# Patient Record
Sex: Male | Born: 1960 | Race: Black or African American | Hispanic: No | Marital: Married | Smoking: Never smoker
Health system: Southern US, Community
[De-identification: ages and names within clinical notes are randomized; demographics above are authoritative.]

## PROBLEM LIST (undated history)

## (undated) DIAGNOSIS — E785 Hyperlipidemia, unspecified: Secondary | ICD-10-CM

## (undated) DIAGNOSIS — I2510 Atherosclerotic heart disease of native coronary artery without angina pectoris: Secondary | ICD-10-CM

## (undated) DIAGNOSIS — T7840XA Allergy, unspecified, initial encounter: Secondary | ICD-10-CM

## (undated) DIAGNOSIS — I10 Essential (primary) hypertension: Secondary | ICD-10-CM

## (undated) DIAGNOSIS — I509 Heart failure, unspecified: Secondary | ICD-10-CM

## (undated) DIAGNOSIS — E119 Type 2 diabetes mellitus without complications: Secondary | ICD-10-CM

## (undated) DIAGNOSIS — R7303 Prediabetes: Secondary | ICD-10-CM

## (undated) HISTORY — DX: Hyperlipidemia, unspecified: E78.5

## (undated) HISTORY — DX: Allergy, unspecified, initial encounter: T78.40XA

## (undated) HISTORY — DX: Atherosclerotic heart disease of native coronary artery without angina pectoris: I25.10

---

## 2004-05-23 ENCOUNTER — Ambulatory Visit: Payer: Self-pay | Admitting: Internal Medicine

## 2004-05-31 ENCOUNTER — Ambulatory Visit: Payer: Self-pay | Admitting: Internal Medicine

## 2006-06-12 ENCOUNTER — Ambulatory Visit: Payer: Self-pay | Admitting: Internal Medicine

## 2006-06-12 LAB — CONVERTED CEMR LAB
ALT: 31 units/L (ref 0–40)
AST: 29 units/L (ref 0–37)
Albumin: 4 g/dL (ref 3.5–5.2)
Alkaline Phosphatase: 47 units/L (ref 39–117)
BUN: 10 mg/dL (ref 6–23)
Basophils Absolute: 0 10*3/uL (ref 0.0–0.1)
Basophils Relative: 0.1 % (ref 0.0–1.0)
Bilirubin, Direct: 0.1 mg/dL (ref 0.0–0.3)
CO2: 31 meq/L (ref 19–32)
Calcium: 9.8 mg/dL (ref 8.4–10.5)
Chloride: 100 meq/L (ref 96–112)
Cholesterol: 193 mg/dL (ref 0–200)
Creatinine, Ser: 1.3 mg/dL (ref 0.4–1.5)
Eosinophils Absolute: 0.2 10*3/uL (ref 0.0–0.6)
Eosinophils Relative: 2.3 % (ref 0.0–5.0)
GFR calc Af Amer: 77 mL/min
GFR calc non Af Amer: 64 mL/min
Glucose, Bld: 103 mg/dL — ABNORMAL HIGH (ref 70–99)
HCT: 40.4 % (ref 39.0–52.0)
HDL: 35 mg/dL — ABNORMAL LOW (ref >39.0)
Hemoglobin: 13.8 g/dL (ref 13.0–17.0)
LDL Cholesterol: 140 mg/dL — ABNORMAL HIGH (ref 0–99)
Lymphocytes Relative: 20.4 % (ref 12.0–46.0)
MCHC: 34.2 g/dL (ref 30.0–36.0)
MCV: 83.5 fL (ref 78.0–100.0)
Monocytes Absolute: 0.9 10*3/uL — ABNORMAL HIGH (ref 0.2–0.7)
Monocytes Relative: 11.4 % — ABNORMAL HIGH (ref 3.0–11.0)
Neutro Abs: 5.2 10*3/uL (ref 1.4–7.7)
Neutrophils Relative %: 65.8 % (ref 43.0–77.0)
PSA: 1.01 ng/mL (ref 0.10–4.00)
Platelets: 239 10*3/uL (ref 150–400)
Potassium: 4.1 meq/L (ref 3.5–5.1)
RBC: 4.83 M/uL (ref 4.22–5.81)
RDW: 12.1 % (ref 11.5–14.6)
Sodium: 138 meq/L (ref 135–145)
TSH: 1.24 microintl units/mL (ref 0.35–5.50)
Total Bilirubin: 0.6 mg/dL (ref 0.3–1.2)
Total CHOL/HDL Ratio: 5.5
Total Protein: 7.9 g/dL (ref 6.0–8.3)
Triglycerides: 90 mg/dL (ref 0–149)
VLDL: 18 mg/dL (ref 0–40)
WBC: 7.9 10*3/uL (ref 4.5–10.5)

## 2006-06-19 ENCOUNTER — Ambulatory Visit: Payer: Self-pay | Admitting: Internal Medicine

## 2010-08-15 ENCOUNTER — Encounter: Payer: Self-pay | Admitting: Internal Medicine

## 2010-08-15 ENCOUNTER — Ambulatory Visit (INDEPENDENT_AMBULATORY_CARE_PROVIDER_SITE_OTHER): Payer: BC Managed Care – PPO | Admitting: Internal Medicine

## 2010-08-15 VITALS — BP 90/60 | Temp 98.3°F | Ht 72.5 in | Wt 197.0 lb

## 2010-08-15 DIAGNOSIS — R55 Syncope and collapse: Secondary | ICD-10-CM

## 2010-08-15 NOTE — Progress Notes (Signed)
  Subjective:    Patient ID: Arthur Sanders, male    DOB: [birth year], 50 y.o.   MRN: [id number]  HPI 50 year old patient who is seen today after a syncopal episode yesterday. He awoke yesterday feeling a bit tired and unwell but in no acute distress. He has had no febrile illness or nausea vomiting or diarrhea. He left his office building and was crossing the street when he noted a episode of mild chest tightness shortness of breath. This lasted a few seconds and his vision darkened and he had a syncopal episode. A coworker was walking 10 feet ahead of him and heard the fall. He was turned to a supine position and was noted to have wandering eye movements but was not responsive. He came around and just a few seconds. He does have a laceration to the chin. He was seen at an urgent care and  studies were performed that sounds more like a urine drug screen. An EKG was not performed. He has had no prior syncopal episodes. He does exercise regularly 30 minutes every morning without exertional symptoms. Since this episode at 4 PM yesterday he has felt a bit tired but has had no recurrent symptoms   Review of Systems  Constitutional: Positive for fatigue. Negative for fever, chills and appetite change.  HENT: Negative for hearing loss, ear pain, congestion, sore throat, trouble swallowing, neck stiffness, dental problem, voice change and tinnitus.   Eyes: Negative for pain, discharge and visual disturbance.  Respiratory: Negative for cough, chest tightness, wheezing and stridor.   Cardiovascular: Negative for chest pain, palpitations and leg swelling.  Gastrointestinal: Negative for nausea, vomiting, abdominal pain, diarrhea, constipation, blood in stool and abdominal distention.  Genitourinary: Negative for urgency, hematuria, flank pain, discharge, difficulty urinating and genital sores.  Musculoskeletal: Negative for myalgias, back pain, joint swelling, arthralgias and gait problem.  Skin: Negative for  rash.  Neurological: Positive for syncope. Negative for dizziness, speech difficulty, weakness, numbness and headaches.  Hematological: Negative for adenopathy. Does not bruise/bleed easily.  Psychiatric/Behavioral: Negative for behavioral problems and dysphoric mood. The patient is not nervous/anxious.        Objective:   Physical Exam  Constitutional: He is oriented to person, place, and time. He appears well-developed.       Blood pressure 130/90 without orthostatic change  HENT:  Head: Normocephalic.  Right Ear: External ear normal.  Left Ear: External ear normal.  Eyes: Conjunctivae and EOM are normal.  Neck: Normal range of motion.  Cardiovascular: Normal rate and regular rhythm.   Murmur heard.      Grade 1/6 systolic murmur at the base  Pulmonary/Chest: Effort normal and breath sounds normal.       O2 saturation 97  Abdominal: Soft. Bowel sounds are normal.  Musculoskeletal: Normal range of motion. He exhibits no edema and no tenderness.  Neurological: He is alert and oriented to person, place, and time.  Psychiatric: He has a normal mood and affect. His behavior is normal.          Assessment & Plan:   No problem-specific assessment & plan notes found for this encounter.  Syncope. We'll check a 12-lead EKG laboratory studies and set up for a 2-D echocardiogram. He does not desire hospital admission at this time will be agreeable if there are any recurrent symptoms

## 2010-08-15 NOTE — Patient Instructions (Signed)
Drink as much fluid as you  can tolerate over the next few days  Call if there are any recurrent symptoms  Echocardiogram as discussed  Return office visit 3 weeks

## 2010-08-18 ENCOUNTER — Ambulatory Visit (HOSPITAL_COMMUNITY): Payer: BC Managed Care – PPO | Attending: Internal Medicine | Admitting: Radiology

## 2010-08-18 DIAGNOSIS — R011 Cardiac murmur, unspecified: Secondary | ICD-10-CM | POA: Insufficient documentation

## 2010-08-18 DIAGNOSIS — R0989 Other specified symptoms and signs involving the circulatory and respiratory systems: Secondary | ICD-10-CM | POA: Insufficient documentation

## 2010-08-18 DIAGNOSIS — R55 Syncope and collapse: Secondary | ICD-10-CM

## 2010-08-18 DIAGNOSIS — R5381 Other malaise: Secondary | ICD-10-CM | POA: Insufficient documentation

## 2010-08-18 DIAGNOSIS — R0609 Other forms of dyspnea: Secondary | ICD-10-CM | POA: Insufficient documentation

## 2010-08-22 NOTE — Progress Notes (Signed)
Quick Note:    Please advise?  ______

## 2010-09-11 ENCOUNTER — Ambulatory Visit (INDEPENDENT_AMBULATORY_CARE_PROVIDER_SITE_OTHER): Payer: BC Managed Care – PPO | Admitting: Internal Medicine

## 2010-09-11 ENCOUNTER — Encounter: Payer: Self-pay | Admitting: Internal Medicine

## 2010-09-11 VITALS — BP 122/84 | HR 68 | Temp 97.7°F | Ht 73.5 in | Wt 197.0 lb

## 2010-09-11 DIAGNOSIS — R55 Syncope and collapse: Secondary | ICD-10-CM

## 2010-09-11 DIAGNOSIS — Z0000 Encounter for general adult medical examination without abnormal findings: Secondary | ICD-10-CM

## 2010-09-11 LAB — BASIC METABOLIC PANEL
BUN: 12 mg/dL (ref 6–23)
Chloride: 103 mEq/L (ref 96–112)
Creatinine, Ser: 1.2 mg/dL (ref 0.4–1.5)
Glucose, Bld: 128 mg/dL — ABNORMAL HIGH (ref 70–99)
Potassium: 5.4 mEq/L — ABNORMAL HIGH (ref 3.5–5.1)

## 2010-09-11 LAB — CBC WITH DIFFERENTIAL/PLATELET
Basophils Relative: 1.1 % (ref 0.0–3.0)
Eosinophils Absolute: 0.1 10*3/uL (ref 0.0–0.7)
HCT: 40.5 % (ref 39.0–52.0)
Hemoglobin: 13.3 g/dL (ref 13.0–17.0)
Lymphocytes Relative: 31.5 % (ref 12.0–46.0)
Lymphs Abs: 1.3 10*3/uL (ref 0.7–4.0)
MCHC: 32.8 g/dL (ref 30.0–36.0)
Monocytes Relative: 10.8 % (ref 3.0–12.0)
Neutro Abs: 2.3 10*3/uL (ref 1.4–7.7)
RBC: 4.72 Mil/uL (ref 4.22–5.81)
RDW: 13.1 % (ref 11.5–14.6)

## 2010-09-11 LAB — LIPID PANEL
HDL: 48.9 mg/dL (ref >39.00)
Triglycerides: 70 mg/dL (ref 0.0–149.0)
VLDL: 14 mg/dL (ref 0.0–40.0)

## 2010-09-11 LAB — HEPATIC FUNCTION PANEL
ALT: 34 U/L (ref 0–53)
Total Protein: 6.7 g/dL (ref 6.0–8.3)

## 2010-09-11 NOTE — Assessment & Plan Note (Signed)
Suspect vasovagal syncope. No further eval necessary

## 2010-09-11 NOTE — Progress Notes (Signed)
  Subjective:    Patient ID: Arthur Sanders, male    DOB: 1961/01/08, 50 y.o.   MRN: [id number]  HPI  cpx   Past Medical History  Diagnosis Date  . Allergy    No past surgical history on file.  reports that he has never smoked. He has never used smokeless tobacco. He reports that he drinks alcohol. He reports that he does not use illicit drugs. family history includes Cancer (age of onset:70) in his father; Diabetes in his father and mother; and Hypertension in his father and mother. No Known Allergies    Review of Systems  patient denies chest pain, shortness of breath, orthopnea. Denies lower extremity edema, abdominal pain, change in appetite, change in bowel movements. Patient denies rashes, musculoskeletal complaints. No other specific complaints in a complete review of systems.      Objective:   Physical Exam Well-developed male in no acute distress. HEENT exam atraumatic, normocephalic, extraocular muscles are intact. Conjunctivae are pink without exudate. Neck is supple without lymphadenopathy, thyromegaly, jugular venous distention. Chest is clear to auscultation without increased work of breathing. Cardiac exam S1-S2 are regular. The PMI is normal. No significant murmurs or gallops. Abdominal exam active bowel sounds, soft, nontender. No abdominal bruits. Extremities no clubbing cyanosis or edema. Peripheral pulses are normal without bruits. Neurologic exam alert and oriented without any motor or sensory deficits.     Assessment & Plan:  Well visit: health maint UTD

## 2011-12-11 ENCOUNTER — Emergency Department (HOSPITAL_COMMUNITY)
Admission: EM | Admit: 2011-12-11 | Discharge: 2011-12-11 | Disposition: A | Discharge status: Home / Self Care | Payer: BC Managed Care – PPO | Attending: Emergency Medicine | Admitting: Emergency Medicine

## 2011-12-11 ENCOUNTER — Emergency Department (HOSPITAL_COMMUNITY): Payer: BC Managed Care – PPO

## 2011-12-11 DIAGNOSIS — R55 Syncope and collapse: Secondary | ICD-10-CM | POA: Insufficient documentation

## 2011-12-11 LAB — CBC WITH DIFFERENTIAL/PLATELET
Eosinophils Absolute: 0.1 10*3/uL (ref 0.0–0.7)
Eosinophils Relative: 1 % (ref 0–5)
Hemoglobin: 13.9 g/dL (ref 13.0–17.0)
Lymphocytes Relative: 26 % (ref 12–46)
Lymphs Abs: 1.4 10*3/uL (ref 0.7–4.0)
MCH: 27.7 pg (ref 26.0–34.0)
MCV: 83.3 fL (ref 78.0–100.0)
Monocytes Relative: 12 % (ref 3–12)
RBC: 5.02 MIL/uL (ref 4.22–5.81)

## 2011-12-11 LAB — POCT I-STAT, CHEM 8
BUN: 8 mg/dL (ref 6–23)
Creatinine, Ser: 1.1 mg/dL (ref 0.50–1.35)
Glucose, Bld: 129 mg/dL — ABNORMAL HIGH (ref 70–99)
Hemoglobin: 15 g/dL (ref 13.0–17.0)
Potassium: 4.6 mEq/L (ref 3.5–5.1)
TCO2: 29 mmol/L (ref 0–100)

## 2011-12-11 NOTE — ED Notes (Addendum)
Pt was walking to car and had syncopal episode. Pt said he felt light headed before event.  Pt received 324 mg aspirin po and 2 sl nitro.

## 2011-12-11 NOTE — ED Notes (Signed)
Patient transported to X-ray 

## 2011-12-11 NOTE — ED Notes (Signed)
AIDET performed. 

## 2011-12-11 NOTE — ED Provider Notes (Signed)
History    CSN: [id number] Arrival date & time 12/11/11  1241 First MD Initiated Contact with Patient 12/11/11 1253    Chief Complaint  Patient presents with  . Loss of Consciousness   HPI The patient presents to the emergency room after having a syncopal episode.  Patient states he was talking to his car when he suddenly started to feel lightheaded. The next thing he remembers is being on the ground. Patient was then brought in by EMS. Patient denies any chest pain or shortness of breath. He denies any numbness or weakness. He has no complaints now and feels fine. He does report having a similar episode back in July of 2012 after running. He had in evaluation at an urgent care and his primary Dr. Billie Ruddy was unremarkable and he has not had any episodes since that time. Patient does exercise but generally doesn't do any aerobic-type activity.  He never has trouble though when working out.  He was given NTG and asa by EMS but he denies every having any chest pain or shortness of breath.  Past Medical History  Diagnosis Date  . Allergy    No past surgical history on file.  Family History  Problem Relation Age of Onset  . Diabetes Mother   . Hypertension Mother   . Diabetes Father   . Hypertension Father   . Cancer Father 44    " bone cancer"    History  Substance Use Topics  . Smoking status: Never Smoker   . Smokeless tobacco: Never Used  . Alcohol Use: Yes    Review of Systems  All other systems reviewed and are negative.   Allergies  Review of patient's allergies indicates no known allergies.  Home Medications   Current Outpatient Rx  Name  Route  Sig  Dispense  Refill  . VITAMIN C PO   Oral   Take 1 tablet by mouth daily.         . IBUPROFEN 200 MG PO TABS   Oral   Take 400 mg by mouth every 6 (six) hours as needed. For pain         . OVER THE COUNTER MEDICATION   Oral   Take 1 capsule by mouth daily. Eye vitamin           BP 120/99  Pulse 75   Temp 97.9 F (36.6 C) (Oral)  Resp 16  SpO2 98%  Physical Exam  Nursing note and vitals reviewed. Constitutional: He appears well-developed and well-nourished. No distress.  HENT:  Head: Normocephalic and atraumatic.  Right Ear: External ear normal.  Left Ear: External ear normal.  Eyes: Conjunctivae normal are normal. Right eye exhibits no discharge. Left eye exhibits no discharge. No scleral icterus.  Neck: Neck supple. No tracheal deviation present.  Cardiovascular: Normal rate, regular rhythm and intact distal pulses.   Pulmonary/Chest: Effort normal and breath sounds normal. No stridor. No respiratory distress. He has no wheezes. He has no rales.  Abdominal: Soft. Bowel sounds are normal. He exhibits no distension. There is no tenderness. There is no rebound and no guarding.  Musculoskeletal: He exhibits no edema and no tenderness.  Neurological: He is alert. He has normal strength. No sensory deficit. Cranial nerve deficit:  no gross defecits noted. He exhibits normal muscle tone. He displays no seizure activity. Coordination normal.  Skin: Skin is warm and dry. No rash noted.  Psychiatric: He has a normal mood and affect.    ED Course  Procedures (including critical care time)  Rate: 67  Rhythm: normal sinus rhythm  QRS Axis: normal  Intervals: normal  ST/T Wave abnormalities: Minimal ST elevation in anterior leads, diffuse T-wave inversion Conduction Disutrbances:none  Narrative Interpretation: Consider left atrial abnormality, minimal  Old EKG Reviewed: no change EKG July 2012 Labs Reviewed  POCT I-STAT, CHEM 8 - Abnormal; Notable for the following:    Glucose, Bld 129 (*)     Calcium, Ion 1.24 (*)     All other components within normal limits  CBC WITH DIFFERENTIAL  POCT I-STAT TROPONIN I  POCT CBG (FASTING - GLUCOSE)-MANUAL ENTRY   Dg Chest 2 View  12/11/2011  *RADIOLOGY REPORT*  Clinical Data: Loss of consciousness.  Syncope.  CHEST - 2 VIEW  Comparison: None.   Findings:  Cardiopericardial silhouette within normal limits. Mediastinal contours normal. Trachea midline.  No airspace disease or effusion.  IMPRESSION: No active cardiopulmonary disease.   Original Report Authenticated By: Andreas Newport, M.D.     1. Syncope     MDM  Pt is a healthy 51 yo male with syncope.  No definite etiology based on his history, exam and evaluation.  The fact that each of his episodes have occurred with exertion warrants further evaluation.  Pt has good outpt follow up with his PCP , Dr Cato Mulligan.  He will call to make outpatient follow up arrangements.        Celene Kras, MD 12/11/11 [phone]

## 2011-12-11 NOTE — ED Notes (Signed)
Pt has left eye swelling and abrasion on eyelid from the syncopal event.  Fall from standing position.

## 2011-12-12 ENCOUNTER — Ambulatory Visit (INDEPENDENT_AMBULATORY_CARE_PROVIDER_SITE_OTHER): Payer: BC Managed Care – PPO | Admitting: Family Medicine

## 2011-12-12 ENCOUNTER — Telehealth: Payer: Self-pay | Admitting: Internal Medicine

## 2011-12-12 ENCOUNTER — Encounter: Payer: Self-pay | Admitting: Family Medicine

## 2011-12-12 VITALS — BP 120/80 | Ht 73.0 in | Wt 199.0 lb

## 2011-12-12 DIAGNOSIS — R55 Syncope and collapse: Secondary | ICD-10-CM

## 2011-12-12 NOTE — Progress Notes (Signed)
  Subjective:    Patient ID: Arthur Sanders, male    DOB: 22-Mar-1960, 51 y.o.   MRN: [id number]  HPI Arthur Sanders  is a 51 year old married male nonsmoker who works for [employer] who comes in today for evaluation of a syncopal episode  He had a syncopal episode last July was taken to a local urgent care evaluation was negative he was referred to cardiology and had some cardiac studies all of which were normal. He seemed to be well until yesterday when he was walking in the parking lot. It was raining he ran about 50 her feet to the scar felt lightheaded but kept on going and then passed out. He woke up in the parking lot fellow K. began driving home but he started to feel lightheaded again. He stopped and a [facility] parking lot and called EMS. He was taken to the emergency room. Examination at that time was normal including blood sugar calcium EKG and chest x-ray.  He's always been in excellent health he's had no chronic health problems he takes no medication. His brother also had a history of syncope. Plays football at [employer]   Review of Systems    general and cardiac review of systems otherwise negative Objective:   Physical Exam Well-developed well-nourished male in no acute distress BP right arm sitting position 120/80 pulse 70 and regular cardiopulmonary exam normal no carotid bruits abdominal exam normal       Assessment & Plan:  Vasovagal syncope plan instructed to go to ground if in the future he begins to feel lightheaded

## 2011-12-12 NOTE — Patient Instructions (Signed)
In the future if he begins to feel lightheaded,,,,,,,,,,,,,, go to ground,,,,,,,,,,,,,,  Return when necessary

## 2011-12-12 NOTE — Telephone Encounter (Signed)
Patient Information:  Caller Name: Cayle  Phone: [phone]  Patient: Arthur Sanders, Arthur Sanders  Gender: Male  DOB: [birth year]  Age: 51 Years  PCP: Birdie Sons (Adults only)   Symptoms  Reason For Call & Symptoms: Fainted yesterday and went to ED and was told his heartbeat was irregular.  Told him to follow up with Dr. Cato Mulligan to get more testing done.  Reviewed Health History In EMR: Yes  Reviewed Medications In EMR: Yes  Reviewed Allergies In EMR: Yes  Date of Onset of Symptoms: 12/11/2011  Treatments Tried: Was seen in ED and given 2 Nitro pills and 4 aspirins by paramedics on the way to ED  Treatments Tried Worked: Yes  Guideline(s) Used:  Fainting  Disposition Per Guideline:   See Today in Office  Reason For Disposition Reached:   All other patients, and now alert and feels fine (Exception: SIMPLE FAINT due to stress, pain, prolonged standing, or suddenly standing)  Advice Given:  N/A  Office Follow Up:  Does the office need to follow up with this patient?: No  Instructions For The Office: N/A  Appointment Scheduled:  12/12/2011 11:00:00  RN Note:  Pt does not currently feel like he is having any irregular heartbeats at thit time.  Appt was scheduled today with Dr. Dr. Tawanna Cooler.  Dr. Cato Mulligan did not have available appts.

## 2011-12-14 ENCOUNTER — Other Ambulatory Visit: Payer: Self-pay | Admitting: Internal Medicine

## 2011-12-14 DIAGNOSIS — R55 Syncope and collapse: Secondary | ICD-10-CM

## 2011-12-18 ENCOUNTER — Telehealth: Payer: Self-pay | Admitting: Internal Medicine

## 2011-12-18 NOTE — Telephone Encounter (Signed)
Follow up appt set 12/17 at 3:30pm w/ Adline Mango.

## 2011-12-20 ENCOUNTER — Telehealth: Payer: Self-pay | Admitting: *Deleted

## 2011-12-20 NOTE — Telephone Encounter (Signed)
Sent request to pcc to schedule pt for monitor.  

## 2011-12-20 NOTE — Telephone Encounter (Signed)
Message copied by Leotis Pain on Thu Dec 20, 2011  4:25 PM ------      Message from: Cherlyn Labella      Created: Fri Dec 14, 2011  3:27 PM       Here is a new order for holter monitor. Thanks for your help!

## 2011-12-26 ENCOUNTER — Telehealth: Payer: Self-pay | Admitting: *Deleted

## 2011-12-26 DIAGNOSIS — R55 Syncope and collapse: Secondary | ICD-10-CM

## 2011-12-26 NOTE — Telephone Encounter (Signed)
Pt rec'd 48 holter monitor 12/26/11. TK 

## 2011-12-28 ENCOUNTER — Encounter (INDEPENDENT_AMBULATORY_CARE_PROVIDER_SITE_OTHER): Payer: BC Managed Care – PPO

## 2011-12-28 DIAGNOSIS — R55 Syncope and collapse: Secondary | ICD-10-CM

## 2012-01-01 ENCOUNTER — Ambulatory Visit (INDEPENDENT_AMBULATORY_CARE_PROVIDER_SITE_OTHER): Payer: BC Managed Care – PPO | Admitting: Family

## 2012-01-01 VITALS — BP 120/94 | HR 67 | Temp 98.1°F | Wt 203.0 lb

## 2012-01-01 DIAGNOSIS — R0789 Other chest pain: Secondary | ICD-10-CM

## 2012-01-01 DIAGNOSIS — R55 Syncope and collapse: Secondary | ICD-10-CM

## 2012-01-02 ENCOUNTER — Encounter: Payer: Self-pay | Admitting: Family

## 2012-01-02 NOTE — Progress Notes (Signed)
Subjective:    Patient ID: Arthur Sanders, male    DOB: 1960-12-25, 51 y.o.   MRN: [id number]  HPI 51 year old African American male, nonsmoker is in as an emergency department followup. He is a patient of Dr. Cato Mulligan. He was seen on 12/12/2011 after a syncopal episode. He was worked up at the emergency department with a noncardiac origin and discharged. He was told to followup here for additional testing. Since then, Dr. Cato Mulligan has ordered a Holter monitor test. Patient reports having that test on last week for cardiology. Patient occasionally reports a dull ache in his chest lasting a few minutes is not associated with any activity. Occurs every 2-3 months. Denies any lightheadedness or dizziness associated with the chest discomfort. Has not had any other syncopal episodes. However, he had a similar one last year with unknown etiology. At that time, he had an echocardiogram that was negative. Has a father who is deceased with lung cancer. An uncle deceased after myocardial infarction and a paternal grandfather with coronary artery disease.   Review of Systems  Constitutional: Negative.   HENT: Negative.   Eyes: Negative.   Respiratory: Negative.   Cardiovascular: Negative.   Gastrointestinal: Negative.   Genitourinary: Negative.   Musculoskeletal: Negative.   Skin: Negative.   Neurological: Negative.   Hematological: Negative.   Psychiatric/Behavioral: Negative.    Past Medical History  Diagnosis Date  . Allergy     History   Social History  . Marital Status: Married    Spouse Name: N/A    Number of Children: N/A  . Years of Education: N/A   Occupational History  . Not on file.   Social History Main Topics  . Smoking status: Never Smoker   . Smokeless tobacco: Never Used  . Alcohol Use: Yes  . Drug Use: No  . Sexually Active: Not on file   Other Topics Concern  . Not on file   Social History Narrative  . No narrative on file    No past surgical history on  file.  Family History  Problem Relation Age of Onset  . Diabetes Mother   . Hypertension Mother   . Diabetes Father   . Hypertension Father   . Cancer Father 39    " bone cancer"    No Known Allergies  Current Outpatient Prescriptions on File Prior to Visit  Medication Sig Dispense Refill  . Ascorbic Acid (VITAMIN C PO) Take 1 tablet by mouth daily.      Marland Kitchen ibuprofen (ADVIL,MOTRIN) 200 MG tablet Take 400 mg by mouth every 6 (six) hours as needed. For pain      . OVER THE COUNTER MEDICATION Take 1 capsule by mouth daily. Eye vitamin        BP 120/94  Pulse 67  Temp 98.1 F (36.7 C) (Oral)  Wt 203 lb (92.08 kg)  SpO2 97%chart    Objective:   Physical Exam  Constitutional: He is oriented to person, place, and time. He appears well-developed and well-nourished.  HENT:  Right Ear: External ear normal.  Left Ear: External ear normal.  Nose: Nose normal.  Mouth/Throat: Oropharynx is clear and moist.  Neck: Normal range of motion. Neck supple.  Cardiovascular: Normal rate, regular rhythm and normal heart sounds.   Pulmonary/Chest: Effort normal and breath sounds normal.  Abdominal: Bowel sounds are normal.  Musculoskeletal: Normal range of motion.  Neurological: He is alert and oriented to person, place, and time.  Skin: Skin is warm and dry.  Psychiatric: He has a normal mood and affect.          Assessment & Plan:  Assessment: Syncope-unknown etiology, chest discomfort  Plan: We'll attempt to obtain the results of his Holter monitor study. Consider referral to either cardiology or neurology for further management of his syncope. I do not see any additional labs any 50 drawn at this point. Therefore we'll followup pending the results of the Holter monitor and decide where to refer to from there.

## 2012-08-17 ENCOUNTER — Emergency Department (HOSPITAL_BASED_OUTPATIENT_CLINIC_OR_DEPARTMENT_OTHER)
Admission: EM | Admit: 2012-08-17 | Discharge: 2012-08-17 | Disposition: A | Discharge status: Home / Self Care | Payer: BC Managed Care – PPO | Attending: Emergency Medicine | Admitting: Emergency Medicine

## 2012-08-17 ENCOUNTER — Encounter (HOSPITAL_BASED_OUTPATIENT_CLINIC_OR_DEPARTMENT_OTHER): Payer: Self-pay

## 2012-08-17 ENCOUNTER — Emergency Department (HOSPITAL_BASED_OUTPATIENT_CLINIC_OR_DEPARTMENT_OTHER): Payer: BC Managed Care – PPO

## 2012-08-17 ENCOUNTER — Other Ambulatory Visit: Payer: Self-pay

## 2012-08-17 DIAGNOSIS — R5381 Other malaise: Secondary | ICD-10-CM | POA: Insufficient documentation

## 2012-08-17 DIAGNOSIS — R002 Palpitations: Secondary | ICD-10-CM

## 2012-08-17 DIAGNOSIS — Z7982 Long term (current) use of aspirin: Secondary | ICD-10-CM | POA: Insufficient documentation

## 2012-08-17 DIAGNOSIS — R42 Dizziness and giddiness: Secondary | ICD-10-CM | POA: Insufficient documentation

## 2012-08-17 LAB — BASIC METABOLIC PANEL
BUN: 14 mg/dL (ref 6–23)
CO2: 28 mEq/L (ref 19–32)
Calcium: 9.9 mg/dL (ref 8.4–10.5)
Chloride: 100 mEq/L (ref 96–112)
Creatinine, Ser: 1.1 mg/dL (ref 0.50–1.35)
Glucose, Bld: 152 mg/dL — ABNORMAL HIGH (ref 70–99)

## 2012-08-17 LAB — CBC WITH DIFFERENTIAL/PLATELET
Basophils Absolute: 0.1 10*3/uL (ref 0.0–0.1)
Eosinophils Relative: 2 % (ref 0–5)
HCT: 38.7 % — ABNORMAL LOW (ref 39.0–52.0)
Hemoglobin: 13 g/dL (ref 13.0–17.0)
Lymphocytes Relative: 38 % (ref 12–46)
MCV: 84.7 fL (ref 78.0–100.0)
Monocytes Absolute: 0.8 10*3/uL (ref 0.1–1.0)
Monocytes Relative: 14 % — ABNORMAL HIGH (ref 3–12)
Neutro Abs: 2.7 10*3/uL (ref 1.7–7.7)
RDW: 12.5 % (ref 11.5–15.5)
WBC: 6 10*3/uL (ref 4.0–10.5)

## 2012-08-17 LAB — TROPONIN I: Troponin I: <0.3 ng/mL (ref <0.30)

## 2012-08-17 NOTE — ED Provider Notes (Signed)
CSN: [id number]     Arrival date & time 08/17/12  0156 History     First MD Initiated Contact with Patient 08/17/12 0221     Chief Complaint  Patient presents with  . Palpitations   (Consider location/radiation/quality/duration/timing/severity/associated sxs/prior Treatment) Patient is a 52 y.o. male presenting with palpitations. The history is provided by the patient. No language interpreter was used.  Palpitations Palpitations quality:  Regular Onset quality:  Sudden Duration:  30 minutes Timing:  Constant Progression:  Unchanged Chronicity:  Recurrent Context: not anxiety, not appetite suppressants and not exercise   Relieved by:  Nothing Worsened by:  Nothing tried Ineffective treatments:  None tried Associated symptoms: malaise/fatigue   Associated symptoms: no back pain, no chest pain, no chest pressure, no cough, no diaphoresis, no nausea, no shortness of breath and no vomiting   Risk factors: no diabetes mellitus and no heart disease     Past Medical History  Diagnosis Date  . Allergy    History reviewed. No pertinent past surgical history. Family History  Problem Relation Age of Onset  . Diabetes Mother   . Hypertension Mother   . Diabetes Father   . Hypertension Father   . Cancer Father 22    " bone cancer"   History  Substance Use Topics  . Smoking status: Never Smoker   . Smokeless tobacco: Never Used  . Alcohol Use: Yes    Review of Systems  Constitutional: Positive for malaise/fatigue. Negative for fever and diaphoresis.  HENT: Negative for neck pain and neck stiffness.   Respiratory: Negative for cough, chest tightness and shortness of breath.   Cardiovascular: Positive for palpitations. Negative for chest pain and leg swelling.  Gastrointestinal: Negative for nausea, vomiting and abdominal pain.  Musculoskeletal: Negative for back pain.  Neurological: Positive for light-headedness. Negative for syncope.  All other systems reviewed and are  negative.    Allergies  Review of patient's allergies indicates no known allergies.  Home Medications   Current Outpatient Rx  Name  Route  Sig  Dispense  Refill  . aspirin 81 MG chewable tablet   Oral   Chew 81 mg by mouth daily.          BP 149/91  Pulse 58  Temp(Src) 98.2 F (36.8 C) (Oral)  Resp 18  SpO2 97% Physical Exam  Constitutional: He is oriented to person, place, and time. He appears well-developed and well-nourished. No distress.  HENT:  Head: Normocephalic and atraumatic.  Mouth/Throat: Oropharynx is clear and moist. No oropharyngeal exudate.  Eyes: Conjunctivae and EOM are normal. Pupils are equal, round, and reactive to light.  Neck: Normal range of motion. Neck supple.  Cardiovascular: Normal rate, regular rhythm and intact distal pulses.   Pulmonary/Chest: Effort normal and breath sounds normal. He has no wheezes. He has no rales.  Abdominal: Soft. Bowel sounds are normal. He exhibits no distension. There is no tenderness. There is no rebound and no guarding.  Musculoskeletal: Normal range of motion. He exhibits no edema.  Neurological: He is alert and oriented to person, place, and time. He has normal reflexes. No cranial nerve deficit.  Skin: Skin is warm and dry. He is not diaphoretic.    ED Course   Procedures (including critical care time)  Labs Reviewed  CBC WITH DIFFERENTIAL  BASIC METABOLIC PANEL  TROPONIN I  D-DIMER, QUANTITATIVE   No results found. No diagnosis found.  MDM   Date: 08/17/2012  Rate: 57  Rhythm: sinus bradycardia  QRS  Axis: normal  Intervals: normal  ST/T Wave abnormalities: nonspecific ST changes  Conduction Disutrbances:none  Narrative Interpretation: non specific changes without change since 08/15/10  Old EKG Reviewed: unchanged   No change in the EKG since 2012.  DDimer is negative.  No neurologic deficits.  No fast or irregular heart beat found on monitor.  2 negative troponins in the setting of ongoing  symptoms excluded ACS.  Patient is scheduled for stress tests next week.  Return to the closes ED for faiting rapid heart beat chest pain shortness of breath sweatiness or any concerns.      Jasmine Awe, MD 08/17/12 [phone]

## 2012-08-17 NOTE — ED Notes (Signed)
Received report from Joss RN

## 2012-08-17 NOTE — ED Notes (Signed)
Patient transported to X-ray 

## 2012-08-17 NOTE — ED Notes (Signed)
Patient here with heart fluttering and dizziness since 0100. Has been having same symptoms and is wearing a heart monitor for same.

## 2012-11-20 ENCOUNTER — Other Ambulatory Visit: Payer: Self-pay

## 2016-11-21 ENCOUNTER — Other Ambulatory Visit: Payer: Self-pay

## 2016-11-21 ENCOUNTER — Emergency Department (HOSPITAL_BASED_OUTPATIENT_CLINIC_OR_DEPARTMENT_OTHER): Payer: 59

## 2016-11-21 ENCOUNTER — Encounter (HOSPITAL_BASED_OUTPATIENT_CLINIC_OR_DEPARTMENT_OTHER): Payer: Self-pay

## 2016-11-21 ENCOUNTER — Emergency Department (HOSPITAL_BASED_OUTPATIENT_CLINIC_OR_DEPARTMENT_OTHER)
Admission: EM | Admit: 2016-11-21 | Discharge: 2016-11-21 | Disposition: A | Discharge status: Home / Self Care | Payer: 59 | Attending: Emergency Medicine | Admitting: Emergency Medicine

## 2016-11-21 DIAGNOSIS — R079 Chest pain, unspecified: Secondary | ICD-10-CM | POA: Insufficient documentation

## 2016-11-21 DIAGNOSIS — Z7982 Long term (current) use of aspirin: Secondary | ICD-10-CM | POA: Diagnosis not present

## 2016-11-21 HISTORY — DX: Prediabetes: R73.03

## 2016-11-21 LAB — CBC
HCT: 39.3 % (ref 39.0–52.0)
HEMOGLOBIN: 13.1 g/dL (ref 13.0–17.0)
MCH: 28.2 pg (ref 26.0–34.0)
MCHC: 33.3 g/dL (ref 30.0–36.0)
MCV: 84.7 fL (ref 78.0–100.0)
Platelets: 198 10*3/uL (ref 150–400)
RBC: 4.64 MIL/uL (ref 4.22–5.81)
RDW: 12.5 % (ref 11.5–15.5)
WBC: 5.6 10*3/uL (ref 4.0–10.5)

## 2016-11-21 LAB — BASIC METABOLIC PANEL
ANION GAP: 7 (ref 5–15)
BUN: 15 mg/dL (ref 6–20)
CALCIUM: 9.5 mg/dL (ref 8.9–10.3)
CHLORIDE: 101 mmol/L (ref 101–111)
CO2: 27 mmol/L (ref 22–32)
Creatinine, Ser: 1.01 mg/dL (ref 0.61–1.24)
GFR calc non Af Amer: >60 mL/min (ref >60)
Glucose, Bld: 150 mg/dL — ABNORMAL HIGH (ref 65–99)
Potassium: 3.7 mmol/L (ref 3.5–5.1)
SODIUM: 135 mmol/L (ref 135–145)

## 2016-11-21 LAB — TROPONIN I

## 2016-11-21 LAB — D-DIMER, QUANTITATIVE: D-Dimer, Quant: <0.27 ug/mL-FEU (ref 0.00–0.50)

## 2016-11-21 MED ORDER — ASPIRIN 81 MG PO CHEW: 324.0000 mg | CHEWABLE_TABLET | Freq: Once | ORAL | Status: DC

## 2016-11-21 NOTE — ED Notes (Signed)
Patient up th BR quick gait, NAD, asking how much longer he needs to wait.

## 2016-11-21 NOTE — ED Provider Notes (Signed)
[facility] EMERGENCY DEPARTMENT Provider Note   CSN: [id number] Arrival date & time: 11/21/16  1214     History   Chief Complaint Chief Complaint  Patient presents with  . Chest Pain    HPI Arthur Sanders is a 56 y.o. male.  HPI Patient had an episode of left-sided chest pain that he describes as sharp in nature this morning around 9 AM.  States he was typing at his desk.  Associated with lightheadedness and some numbness to his left hand.  Denied shortness of breath, nausea or vomiting.  States he is currently pain-free though he does have some lightheadedness.  Has had cardiac workup in the past stating he had a normal stress test.  Does not smoke cigarettes.  No family history of coronary artery disease.  No recent extended travel or immobilization.  No lower extremity swelling or pain.  Denies recent cough, fever or chills. Past Medical History:  Diagnosis Date  . Allergy   . Borderline diabetes     Patient Active Problem List   Diagnosis Date Noted  . Syncope 09/11/2010    History reviewed. No pertinent surgical history.     Home Medications    Prior to Admission medications   Medication Sig Start Date End Date Taking? Authorizing Provider  aspirin 81 MG chewable tablet Chew 81 mg by mouth daily.    [provider]    Family History Family History  Problem Relation Age of Onset  . Diabetes Mother   . Hypertension Mother   . Diabetes Father   . Hypertension Father   . Cancer Father 1270       " bone cancer"    Social History Social History   Tobacco Use  . Smoking status: Never Smoker  . Smokeless tobacco: Never Used  Substance Use Topics  . Alcohol use: Yes    Comment: occ  . Drug use: No     Allergies   Patient has no known allergies.   Review of Systems Review of Systems  Constitutional: Negative for chills and fever.  Eyes: Negative for visual disturbance.  Respiratory: Negative for cough and shortness of  breath.   Cardiovascular: Positive for chest pain. Negative for palpitations and leg swelling.  Gastrointestinal: Negative for abdominal pain, diarrhea, nausea and vomiting.  Musculoskeletal: Negative for back pain, myalgias, neck pain and neck stiffness.  Skin: Negative for rash and wound.  Neurological: Positive for light-headedness and numbness. Negative for weakness and headaches.  All other systems reviewed and are negative.    Physical Exam Updated Vital Signs BP (!) 154/104   Pulse (!) 55   Temp 97.8 F (36.6 C) (Oral)   Resp 13   Ht 6\' 1"  (1.854 m)   Wt 91.6 kg (202 lb)   SpO2 100%   BMI 26.65 kg/m   Physical Exam  Constitutional: He is oriented to person, place, and time. He appears well-developed and well-nourished.  Non-toxic appearance. He does not appear ill. No distress.  HENT:  Head: Normocephalic and atraumatic.  Mouth/Throat: Oropharynx is clear and moist.  Eyes: EOM are normal. Pupils are equal, round, and reactive to light.  Neck: Normal range of motion. Neck supple.  Cardiovascular: Normal rate, regular rhythm and normal pulses.  Murmur heard.  Systolic murmur is present with a grade of 1/6. Pulmonary/Chest: Effort normal and breath sounds normal. No accessory muscle usage or stridor. No tachypnea. No respiratory distress. He has no decreased breath sounds. He has no wheezes.  He has no rhonchi. He has no rales.  Abdominal: Soft. Bowel sounds are normal. There is no tenderness. There is no rebound and no guarding.  Musculoskeletal: Normal range of motion. He exhibits no edema or tenderness.  No lower extremity swelling, asymmetry or tenderness.  Distal pulses are 2+.  No midline thoracic or lumbar tenderness.  No CVA tenderness.  Neurological: He is alert and oriented to person, place, and time.  Skin: Skin is warm and dry. Capillary refill takes less than 2 seconds. No rash noted. No erythema.  Psychiatric: He has a normal mood and affect. His behavior is  normal.  Nursing note and vitals reviewed.    ED Treatments / Results  Labs (all labs ordered are listed, but only abnormal results are displayed) Labs Reviewed  BASIC METABOLIC PANEL - Abnormal; Notable for the following components:      Result Value   Glucose, Bld 150 (*)    All other components within normal limits  CBC  TROPONIN I  D-DIMER, QUANTITATIVE ([facility])  TROPONIN I    EKG  EKG Interpretation  Date/Time:  Wednesday November 21 2016 12:17:49 EST Ventricular Rate:  65 PR Interval:  160 QRS Duration: 90 QT Interval:  402 QTC Calculation: 418 R Axis:   52 Text Interpretation:  Normal sinus rhythm T wave abnormality, consider inferior ischemia Abnormal ECG Confirmed by Loren RacerYelverton, Talis Iwan ([id number]) on 11/21/2016 12:55:23 PM       Radiology No results found.  Procedures Procedures (including critical care time)  Medications Ordered in ED Medications - No data to display   Initial Impression / Assessment and Plan / ED Course  I have reviewed the triage vital signs and the nursing notes.  Pertinent labs & imaging results that were available during my care of the patient were reviewed by me and considered in my medical decision making (see chart for details).     Patient remains chest pain-free.  D-dimer is negative.  Troponin x2 is negative.  Chest pain appears atypical.  Patient is asking to be discharged home.  Have encouraged him to follow-up closely with a cardiologist.  He is been given return precautions and has voiced understanding.  Final Clinical Impressions(s) / ED Diagnoses   Final diagnoses:  Nonspecific chest pain    ED Discharge Orders    None       Loren RacerYelverton, Saleema Weppler, MD 11/26/16 1659

## 2016-11-21 NOTE — ED Triage Notes (Signed)
C/o intermittent CP x 3 hours-NAD-steady gait

## 2016-11-21 NOTE — ED Notes (Signed)
Patient hooked up to monitor with vitals set to Q30 mins.

## 2019-06-17 ENCOUNTER — Encounter (HOSPITAL_BASED_OUTPATIENT_CLINIC_OR_DEPARTMENT_OTHER): Payer: Self-pay

## 2019-06-17 ENCOUNTER — Emergency Department (HOSPITAL_BASED_OUTPATIENT_CLINIC_OR_DEPARTMENT_OTHER): Payer: 59

## 2019-06-17 ENCOUNTER — Other Ambulatory Visit: Payer: Self-pay

## 2019-06-17 ENCOUNTER — Emergency Department (HOSPITAL_BASED_OUTPATIENT_CLINIC_OR_DEPARTMENT_OTHER)
Admission: EM | Admit: 2019-06-17 | Discharge: 2019-06-17 | Disposition: A | Discharge status: Home / Self Care | Payer: 59 | Attending: Emergency Medicine | Admitting: Emergency Medicine

## 2019-06-17 DIAGNOSIS — R030 Elevated blood-pressure reading, without diagnosis of hypertension: Secondary | ICD-10-CM | POA: Diagnosis not present

## 2019-06-17 DIAGNOSIS — Z91012 Allergy to eggs: Secondary | ICD-10-CM | POA: Insufficient documentation

## 2019-06-17 DIAGNOSIS — R739 Hyperglycemia, unspecified: Secondary | ICD-10-CM

## 2019-06-17 DIAGNOSIS — R002 Palpitations: Secondary | ICD-10-CM | POA: Diagnosis present

## 2019-06-17 DIAGNOSIS — Z7982 Long term (current) use of aspirin: Secondary | ICD-10-CM | POA: Insufficient documentation

## 2019-06-17 LAB — CBC
HCT: 42 % (ref 39.0–52.0)
Hemoglobin: 14 g/dL (ref 13.0–17.0)
MCH: 28.2 pg (ref 26.0–34.0)
MCHC: 33.3 g/dL (ref 30.0–36.0)
MCV: 84.5 fL (ref 80.0–100.0)
Platelets: 212 10*3/uL (ref 150–400)
RBC: 4.97 MIL/uL (ref 4.22–5.81)
RDW: 12.4 % (ref 11.5–15.5)
WBC: 6.1 10*3/uL (ref 4.0–10.5)
nRBC: 0 % (ref 0.0–0.2)

## 2019-06-17 LAB — BASIC METABOLIC PANEL
Anion gap: 10 (ref 5–15)
BUN: 13 mg/dL (ref 6–20)
CO2: 26 mmol/L (ref 22–32)
Calcium: 9.4 mg/dL (ref 8.9–10.3)
Chloride: 97 mmol/L — ABNORMAL LOW (ref 98–111)
Creatinine, Ser: 1.12 mg/dL (ref 0.61–1.24)
GFR calc Af Amer: >60 mL/min (ref >60)
GFR calc non Af Amer: >60 mL/min (ref >60)
Glucose, Bld: 251 mg/dL — ABNORMAL HIGH (ref 70–99)
Potassium: 4 mmol/L (ref 3.5–5.1)
Sodium: 133 mmol/L — ABNORMAL LOW (ref 135–145)

## 2019-06-17 LAB — TROPONIN I (HIGH SENSITIVITY)
Troponin I (High Sensitivity): 4 ng/L (ref <18)
Troponin I (High Sensitivity): 6 ng/L (ref <18)

## 2019-06-17 MED ORDER — SODIUM CHLORIDE 0.9% FLUSH
3.0000 mL | Freq: Once | INTRAVENOUS | Status: DC
  Filled 2019-06-17: qty 3

## 2019-06-17 NOTE — Discharge Instructions (Signed)
Please read and follow all provided instructions.  Your diagnoses today include:  1. Palpitations   2. Elevated blood pressure reading   3. Elevated blood sugar     Tests performed today include:  An EKG of your heart - shows t-wave inversions that have been present in the past  A chest x-ray  Cardiac enzymes - a blood test for heart muscle damage  Blood counts and electrolytes - elevated blood sugar  Vital signs. See below for your results today.   Medications prescribed:   None  Take any prescribed medications only as directed.  Follow-up instructions: Please follow-up with your primary care provider as soon as you can for further evaluation of your symptoms.   Return instructions:  SEEK IMMEDIATE MEDICAL ATTENTION IF:  You have severe chest pain, especially if the pain is crushing or pressure-like and spreads to the arms, back, neck, or jaw, or if you have sweating, nausea (feeling sick to your stomach), or shortness of breath. THIS IS AN EMERGENCY. Don't wait to see if the pain will go away. Get medical help at once. Call 911 or 0 (operator). DO NOT drive yourself to the hospital.   Your chest pain gets worse and does not go away with rest.   You have an attack of chest pain lasting longer than usual, despite rest and treatment with the medications your caregiver has prescribed.   You wake from sleep with chest pain or shortness of breath.  You feel dizzy or faint.  You have chest pain not typical of your usual pain for which you originally saw your caregiver.   You have any other emergent concerns regarding your health.  Additional Information: Chest pain comes from many different causes. Your caregiver has diagnosed you as having chest pain that is not specific for one problem, but does not require admission.  You are at low risk for an acute heart condition or other serious illness.   Your vital signs today were: BP (!) 157/95 (BP Location: Left Arm)   Pulse  65   Temp 98.6 F (37 C) (Oral)   Resp 14   Ht 6\' 1"  (1.854 m)   Wt 90.7 kg   SpO2 100%   BMI 26.39 kg/m  If your blood pressure (BP) was elevated above 135/85 this visit, please have this repeated by your doctor within one month. --------------

## 2019-06-17 NOTE — ED Triage Notes (Signed)
Pt c/o feeling heart racing started ~1pm-denies pain-feeling of indigestion-NAD-steady gait

## 2019-06-17 NOTE — ED Provider Notes (Signed)
8:46 PM signout from [employer] at shift change.  Patient pending second troponin.  This was negative.  I reviewed the patient's work-up tonight.  EKG with T wave inversions demonstrated in past EKGs.  Patient looks well, symptoms are controlled.  He is comfortable with discharge home with PCP follow-up.  We talked about rechecking blood pressure when he is feeling better.  He will need to have his blood sugars rechecked as an outpatient.  Patient was counseled to return with severe chest pain, especially if the pain is crushing or pressure-like and spreads to the arms, back, neck, or jaw, or if they have sweating, nausea, or shortness of breath with the pain. They were encouraged to call 911 with these symptoms.   The patient verbalized understanding and agreed.   BP (!) 157/95 (BP Location: Left Arm)   Pulse 65   Temp 98.6 F (37 C) (Oral)   Resp 14   Ht 6\' 1"  (1.854 m)   Wt 90.7 kg   SpO2 100%   BMI 26.39 kg/m     , [name] 06/17/19 2047    2048, DO 06/17/19 2203

## 2019-06-17 NOTE — ED Notes (Signed)
Ok per [employer] for pt to have ice water. Pt given the same.

## 2019-06-17 NOTE — ED Provider Notes (Signed)
[facility] EMERGENCY DEPARTMENT Provider Note   CSN: [id number] Arrival date & time: 06/17/19  1700     History Chief Complaint  Patient presents with   Tachycardia    Arthur Sanders is a 59 y.o. male.  59 year old male with history of prediabetes presents with complaint of palpitations.  Patient states that he ate lunch around 1:00 this afternoon and then started to feel like his heart was beating hard.  This sensation persisted until he got off work at 330 at which point he decided to go to urgent care for an evaluation.  Patient was told that he had some concerning changes on his EKG and was sent to the emergency room for further evaluation.  At this time, patient states that he has a discomfort similar to indigestion just above his upper sternum area.  He denies shortness of breath, diaphoresis, nausea, vomiting, chest tightness or chest pain.  Patient is a non-smoker, is not medicated for his prediabetes, has no history of hypertension or hyperlipidemia.  No significant family history.  No prior cardiac history.  No other complaints or concerns.        Past Medical History:  Diagnosis Date   Allergy    Borderline diabetes     Patient Active Problem List   Diagnosis Date Noted   Syncope 09/11/2010    History reviewed. No pertinent surgical history.     Family History  Problem Relation Age of Onset   Diabetes Mother    Hypertension Mother    Diabetes Father    Hypertension Father    Cancer Father 25       " bone cancer"    Social History   Tobacco Use   Smoking status: Never Smoker   Smokeless tobacco: Never Used  Substance Use Topics   Alcohol use: Yes    Comment: weekly   Drug use: No    Home Medications Prior to Admission medications   Medication Sig Start Date End Date Taking? Authorizing Provider  aspirin 81 MG chewable tablet Chew 81 mg by mouth daily.    [provider]    Allergies    Egg [eggs or  egg-derived products]  Review of Systems   Review of Systems  Constitutional: Negative for diaphoresis and fever.  Respiratory: Negative for shortness of breath.   Cardiovascular: Positive for palpitations. Negative for chest pain and leg swelling.  Gastrointestinal: Negative for abdominal pain, nausea and vomiting.  Musculoskeletal: Negative for arthralgias, myalgias and neck pain.  Skin: Negative for rash and wound.  Allergic/Immunologic: Negative for immunocompromised state.  Neurological: Negative for weakness.  Hematological: Negative for adenopathy.  All other systems reviewed and are negative.   Physical Exam Updated Vital Signs BP (!) 188/103 (BP Location: Left Arm)    Pulse 80    Temp 98.6 F (37 C) (Oral)    Resp 18    Ht 6\' 1"  (1.854 m)    Wt 90.7 kg    SpO2 99%    BMI 26.39 kg/m   Physical Exam Vitals and nursing note reviewed.  Constitutional:      General: He is not in acute distress.    Appearance: He is well-developed. He is not diaphoretic.  HENT:     Head: Normocephalic and atraumatic.  Cardiovascular:     Rate and Rhythm: Normal rate and regular rhythm.     Pulses: Normal pulses.     Heart sounds: Normal heart sounds.  Pulmonary:     Effort:  Pulmonary effort is normal.     Breath sounds: Normal breath sounds.  Chest:     Chest wall: No tenderness.  Abdominal:     Palpations: Abdomen is soft.     Tenderness: There is no abdominal tenderness.  Musculoskeletal:     Right lower leg: No edema.     Left lower leg: No edema.  Skin:    General: Skin is warm and dry.     Findings: No erythema or rash.  Neurological:     Mental Status: He is alert and oriented to person, place, and time.  Psychiatric:        Behavior: Behavior normal.     ED Results / Procedures / Treatments   Labs (all labs ordered are listed, but only abnormal results are displayed) Labs Reviewed  BASIC METABOLIC PANEL - Abnormal; Notable for the following components:      Result  Value   Sodium 133 (*)    Chloride 97 (*)    Glucose, Bld 251 (*)    All other components within normal limits  CBC  TROPONIN I (HIGH SENSITIVITY)    EKG EKG Interpretation  Date/Time:  Wednesday June 17 2019 17:00:44 EDT Ventricular Rate:  75 PR Interval:  158 QRS Duration: 90 QT Interval:  358 QTC Calculation: 399 R Axis:   18 Text Interpretation: Normal sinus rhythm with sinus arrhythmia Cannot rule out Anterior infarct , age undetermined T wave abnormality, consider inferior ischemia Abnormal ECG No significant change since last tracing Confirmed by Melene Plan [phone]) on 06/17/2019 5:14:30 PM   Radiology DG Chest 2 View  Result Date: 06/17/2019 CLINICAL DATA:  Tachycardia, indigestion. EXAM: CHEST - 2 VIEW COMPARISON:  Prior chest radiographs 11/21/2016 and earlier FINDINGS: Heart size within normal limits. There is no appreciable airspace consolidation. No evidence of pleural effusion or pneumothorax. No acute bony abnormality identified. IMPRESSION: No evidence of acute cardiopulmonary abnormality. Electronically Signed   By: Jackey Loge DO   On: 06/17/2019 17:47    Procedures Procedures (including critical care time)  Medications Ordered in ED Medications - No data to display  ED Course  I have reviewed the triage vital signs and the nursing notes.  Pertinent labs & imaging results that were available during my care of the patient were reviewed by me and considered in my medical decision making (see chart for details).  Clinical Course as of Jun 16 1813  Wed Jun 17, 2019  1813 59yo male with complaint of palpitations this afternoon after eating lunch, now with feeling of indigestion at upper sternum. EKG reviewed by Dr. Adela Lank, unchanged from prior. CBC WNL. BMP with glucose 251. Patient is hypertensive. Will monitor vitals, plan for troponin x 2.  CXR unremarkable.  Care signed out pending further work up.    [LM]    Clinical Course User Index [LM] Alden Hipp   MDM Rules/Calculators/A&P                      Final Clinical Impression(s) / ED Diagnoses Final diagnoses:  None    Rx / DC Orders ED Discharge Orders    None       Jeannie Fend, [name] 06/17/19 1815    Melene Plan, DO 06/17/19 1916

## 2020-01-16 ENCOUNTER — Emergency Department (HOSPITAL_BASED_OUTPATIENT_CLINIC_OR_DEPARTMENT_OTHER): Payer: 59

## 2020-01-16 ENCOUNTER — Emergency Department (HOSPITAL_BASED_OUTPATIENT_CLINIC_OR_DEPARTMENT_OTHER)
Admission: EM | Admit: 2020-01-16 | Discharge: 2020-01-16 | Disposition: A | Discharge status: Home / Self Care | Payer: 59 | Attending: Emergency Medicine | Admitting: Emergency Medicine

## 2020-01-16 ENCOUNTER — Encounter (HOSPITAL_BASED_OUTPATIENT_CLINIC_OR_DEPARTMENT_OTHER): Payer: Self-pay | Admitting: Emergency Medicine

## 2020-01-16 ENCOUNTER — Other Ambulatory Visit: Payer: Self-pay

## 2020-01-16 DIAGNOSIS — Z7982 Long term (current) use of aspirin: Secondary | ICD-10-CM | POA: Diagnosis not present

## 2020-01-16 DIAGNOSIS — R0789 Other chest pain: Secondary | ICD-10-CM | POA: Diagnosis not present

## 2020-01-16 DIAGNOSIS — Z79899 Other long term (current) drug therapy: Secondary | ICD-10-CM | POA: Insufficient documentation

## 2020-01-16 DIAGNOSIS — I110 Hypertensive heart disease with heart failure: Secondary | ICD-10-CM | POA: Diagnosis not present

## 2020-01-16 DIAGNOSIS — E119 Type 2 diabetes mellitus without complications: Secondary | ICD-10-CM | POA: Diagnosis not present

## 2020-01-16 DIAGNOSIS — I509 Heart failure, unspecified: Secondary | ICD-10-CM | POA: Insufficient documentation

## 2020-01-16 DIAGNOSIS — Z7984 Long term (current) use of oral hypoglycemic drugs: Secondary | ICD-10-CM | POA: Insufficient documentation

## 2020-01-16 HISTORY — DX: Type 2 diabetes mellitus without complications: E11.9

## 2020-01-16 HISTORY — DX: Essential (primary) hypertension: I10

## 2020-01-16 HISTORY — DX: Heart failure, unspecified: I50.9

## 2020-01-16 LAB — BASIC METABOLIC PANEL
Anion gap: 11 (ref 5–15)
BUN: 17 mg/dL (ref 6–20)
CO2: 26 mmol/L (ref 22–32)
Calcium: 9.7 mg/dL (ref 8.9–10.3)
Chloride: 96 mmol/L — ABNORMAL LOW (ref 98–111)
Creatinine, Ser: 1.4 mg/dL — ABNORMAL HIGH (ref 0.61–1.24)
GFR, Estimated: 59 mL/min — ABNORMAL LOW (ref >60)
Glucose, Bld: 179 mg/dL — ABNORMAL HIGH (ref 70–99)
Potassium: 3.7 mmol/L (ref 3.5–5.1)
Sodium: 133 mmol/L — ABNORMAL LOW (ref 135–145)

## 2020-01-16 LAB — CBC WITH DIFFERENTIAL/PLATELET
Abs Immature Granulocytes: 0.04 10*3/uL (ref 0.00–0.07)
Basophils Absolute: 0.1 10*3/uL (ref 0.0–0.1)
Basophils Relative: 1 %
Eosinophils Absolute: 0.2 10*3/uL (ref 0.0–0.5)
Eosinophils Relative: 3 %
HCT: 39.3 % (ref 39.0–52.0)
Hemoglobin: 12.9 g/dL — ABNORMAL LOW (ref 13.0–17.0)
Immature Granulocytes: 1 %
Lymphocytes Relative: 40 %
Lymphs Abs: 2.7 10*3/uL (ref 0.7–4.0)
MCH: 28 pg (ref 26.0–34.0)
MCHC: 32.8 g/dL (ref 30.0–36.0)
MCV: 85.4 fL (ref 80.0–100.0)
Monocytes Absolute: 1 10*3/uL (ref 0.1–1.0)
Monocytes Relative: 14 %
Neutro Abs: 2.8 10*3/uL (ref 1.7–7.7)
Neutrophils Relative %: 41 %
Platelets: 242 10*3/uL (ref 150–400)
RBC: 4.6 MIL/uL (ref 4.22–5.81)
RDW: 12.1 % (ref 11.5–15.5)
WBC: 6.9 10*3/uL (ref 4.0–10.5)
nRBC: 0 % (ref 0.0–0.2)

## 2020-01-16 LAB — TROPONIN I (HIGH SENSITIVITY)
Troponin I (High Sensitivity): 4 ng/L (ref <18)
Troponin I (High Sensitivity): 4 ng/L (ref <18)

## 2020-01-16 LAB — MAGNESIUM: Magnesium: 1.8 mg/dL (ref 1.7–2.4)

## 2020-01-16 MED ORDER — SODIUM CHLORIDE 0.9 % IV BOLUS
1000.0000 mL | Freq: Once | INTRAVENOUS | Status: AC
  Administered 2020-01-16: 1000 mL via INTRAVENOUS

## 2020-01-16 NOTE — ED Provider Notes (Signed)
[facility] Provider Note: Arthur Dell, MD, FACEP  CSN: [id number] MRN: [id number] ARRIVAL: 01/16/20 at 0419 ROOM: [facility]   CHIEF COMPLAINT  Chest Pain   HISTORY OF PRESENT ILLNESS  01/16/20 4:30 AM Arthur Sanders is a 60 y.o. male who has had 2 days of intermittent chest pain.  He describes the pain as being sharp and stabbing.  It occurs in the left lower chest.  It occurs without provocation and nothing makes it better or worse.  It tends to last seconds to minutes.  It is moderate in severity.  He has had no associated shortness of breath, nausea, vomiting or diaphoresis.  He is here this morning because he had an episode that was worse in severity and seemed to radiate to his jaw.  He is also feeling weak.   Past Medical History:  Diagnosis Date  . Allergy   . CHF (congestive heart failure) (HCC)   . Diabetes mellitus without complication (HCC)   . Hypertension     History reviewed. No pertinent surgical history.  Family History  Problem Relation Age of Onset  . Diabetes Mother   . Hypertension Mother   . Diabetes Father   . Hypertension Father   . Cancer Father 43       " bone cancer"    Social History   Tobacco Use  . Smoking status: Never Smoker  . Smokeless tobacco: Never Used  Vaping Use  . Vaping Use: Never used  Substance Use Topics  . Alcohol use: Yes    Comment: weekly  . Drug use: No    Prior to Admission medications   Medication Sig Start Date End Date Taking? Authorizing Provider  metFORMIN (GLUCOPHAGE) 500 MG tablet Take by mouth. 12/25/16  Yes [provider]  aspirin 81 MG chewable tablet Chew 81 mg by mouth daily.    [provider]  omeprazole (PRILOSEC) 40 MG capsule Take 40 mg by mouth daily. 09/11/19   [provider]  rosuvastatin (CRESTOR) 5 MG tablet Take 5 mg by mouth daily. 12/01/19   [provider]  triamterene-hydrochlorothiazide (MAXZIDE-25) 37.5-25 MG tablet Take 1  tablet by mouth daily. 12/16/19   [provider]    Allergies Egg [eggs or egg-derived products]   REVIEW OF SYSTEMS  Negative except as noted here or in the History of Present Illness.   PHYSICAL EXAMINATION  Initial Vital Signs Blood pressure (!) 164/94, pulse 68, temperature 98.3 F (36.8 C), resp. rate 13, height 6\' 1"  (1.854 m), weight 83.9 kg, SpO2 100 %.  Examination General: Well-developed, well-nourished male in no acute distress; appearance consistent with age of record HENT: normocephalic; atraumatic Eyes: pupils equal, round and reactive to light; extraocular muscles intact Neck: supple Heart: regular rate and rhythm; no murmur Lungs: clear to auscultation bilaterally Abdomen: soft; nondistended; nontender; bowel sounds present Extremities: No deformity; full range of motion; pulses normal Neurologic: Awake, alert and oriented; motor function intact in all extremities and symmetric; no facial droop; tremulous Skin: Warm and dry Psychiatric: Normal mood and affect   RESULTS  Summary of this visit's results, reviewed and interpreted by myself:   EKG Interpretation  Date/Time:  Saturday January 16 2020 04:27:36 EST Ventricular Rate:  70 PR Interval:    QRS Duration: 102 QT Interval:  415 QTC Calculation: 448 R Axis:   51 Text Interpretation: Sinus rhythm Nonspecific T abnormalities, inferior leads No significant change was found Confirmed by [id number] (Paula Libra) on 01/16/2020 4:33:21  AM      Laboratory Studies: Results for orders placed or performed during the hospital encounter of 01/16/20 (from the past 24 hour(s))  CBC with Differential/Platelet     Status: Abnormal   Collection Time: 01/16/20  4:29 AM  Result Value Ref Range   WBC 6.9 4.0 - 10.5 K/uL   RBC 4.60 4.22 - 5.81 MIL/uL   Hemoglobin 12.9 (L) 13.0 - 17.0 g/dL   HCT 79.0 24.0 - 97.3 %   MCV 85.4 80.0 - 100.0 fL   MCH 28.0 26.0 - 34.0 pg   MCHC 32.8 30.0 - 36.0 g/dL   RDW 53.2 99.2  - 42.6 %   Platelets 242 150 - 400 K/uL   nRBC 0.0 0.0 - 0.2 %   Neutrophils Relative % 41 %   Neutro Abs 2.8 1.7 - 7.7 K/uL   Lymphocytes Relative 40 %   Lymphs Abs 2.7 0.7 - 4.0 K/uL   Monocytes Relative 14 %   Monocytes Absolute 1.0 0.1 - 1.0 K/uL   Eosinophils Relative 3 %   Eosinophils Absolute 0.2 0.0 - 0.5 K/uL   Basophils Relative 1 %   Basophils Absolute 0.1 0.0 - 0.1 K/uL   Immature Granulocytes 1 %   Abs Immature Granulocytes 0.04 0.00 - 0.07 K/uL  Basic metabolic panel     Status: Abnormal   Collection Time: 01/16/20  4:29 AM  Result Value Ref Range   Sodium 133 (L) 135 - 145 mmol/L   Potassium 3.7 3.5 - 5.1 mmol/L   Chloride 96 (L) 98 - 111 mmol/L   CO2 26 22 - 32 mmol/L   Glucose, Bld 179 (H) 70 - 99 mg/dL   BUN 17 6 - 20 mg/dL   Creatinine, Ser 8.34 (H) 0.61 - 1.24 mg/dL   Calcium 9.7 8.9 - 19.6 mg/dL   GFR, Estimated 59 (L) >60 mL/min   Anion gap 11 5 - 15  Troponin I (High Sensitivity)     Status: None   Collection Time: 01/16/20  4:29 AM  Result Value Ref Range   Troponin I (High Sensitivity) 4 <18 ng/L  Magnesium     Status: None   Collection Time: 01/16/20  4:29 AM  Result Value Ref Range   Magnesium 1.8 1.7 - 2.4 mg/dL   Imaging Studies: DG Chest 2 View  Result Date: 01/16/2020 CLINICAL DATA:  Chest pain EXAM: CHEST - 2 VIEW COMPARISON:  06/17/2019 FINDINGS: The heart size and mediastinal contours are within normal limits. Both lungs are clear. The visualized skeletal structures are unremarkable. IMPRESSION: No active cardiopulmonary disease. Electronically Signed   By: Deatra Robinson M.D.   On: 01/16/2020 04:54    ED COURSE and MDM  Nursing notes, initial and subsequent vitals signs, including pulse oximetry, reviewed and interpreted by myself.  Vitals:   01/16/20 0428 01/16/20 0514  BP: (!) 164/94 (!) 133/101  Pulse: 68 68  Resp: 13 19  Temp: 98.3 F (36.8 C)   SpO2: 100% 99%  Weight: 83.9 kg   Height: 6\' 1"  (1.854 m)    Medications   sodium chloride 0.9 % bolus 1,000 mL (1,000 mLs Intravenous New Bag/Given 01/16/20 0513)   6:52 AM Patient feeling better after IV fluid bolus. He may have gotten dehydrated from elevated sugar. His presentation is somewhat atypical for cardiac origin pain. His EKG is unchanged and his 1st troponin is normal. If his 2nd troponin is also normal he is comfortable going home. He has cardiology follow-up already  scheduled for Wednesday (4 days from today). He has seen a cardiologist in the past for similar pain and had a negative stress test.   PROCEDURES  Procedures   ED DIAGNOSES     ICD-10-CM   1. Atypical chest pain  R07.89        Shanon Rosser, MD 01/16/20 0700

## 2020-01-16 NOTE — ED Triage Notes (Signed)
Patient presents with complaints of left chest pain onset 2 days ago; states pain intermittent with ongoing generalized weakness. Denies NVD; denies fever. nad noted.

## 2020-01-16 NOTE — ED Provider Notes (Signed)
Signout from Dr. Read Drivers.  60 year old male here with atypical chest pain.  Work-up so far has been negative.  Plan is to follow-up on second troponin.  If not significantly elevated can be discharged to follow-up with cardiology as scheduled. Physical Exam  BP (!) 133/101   Pulse 68   Temp 98.3 F (36.8 C)   Resp 19   Ht 6\' 1"  (1.854 m)   Wt 83.9 kg   SpO2 99%   BMI 24.41 kg/m   Physical Exam  ED Course/Procedures     Procedures  MDM  Repeat troponin flat.  Will follow through with plan from Dr. for discharge.       Read Drivers, MD 01/16/20 [phone]

## 2020-01-20 ENCOUNTER — Ambulatory Visit: Payer: 59 | Admitting: Cardiology

## 2020-01-20 ENCOUNTER — Other Ambulatory Visit: Payer: Self-pay

## 2020-01-20 ENCOUNTER — Encounter: Payer: Self-pay | Admitting: Cardiology

## 2020-01-20 VITALS — BP 143/81 | HR 68 | Ht 73.0 in | Wt 189.0 lb

## 2020-01-20 DIAGNOSIS — I10 Essential (primary) hypertension: Secondary | ICD-10-CM

## 2020-01-20 DIAGNOSIS — E782 Mixed hyperlipidemia: Secondary | ICD-10-CM

## 2020-01-20 DIAGNOSIS — R072 Precordial pain: Secondary | ICD-10-CM

## 2020-01-20 DIAGNOSIS — E119 Type 2 diabetes mellitus without complications: Secondary | ICD-10-CM

## 2020-01-20 MED ORDER — METOPROLOL TARTRATE 25 MG PO TABS: 25.0000 mg | ORAL_TABLET | Freq: Two times a day (BID) | ORAL | 0 refills | Status: DC

## 2020-01-20 NOTE — Progress Notes (Signed)
Date:  01/20/2020   ID:  Arthur Sanders, DOB Apr 09, 1960, MRN [id number]  PCP:  Arthur Blocker, MD  Cardiologist:  Rex Kras, DO, [facility] (established care 01/20/2020)  REASON FOR CONSULT: Chest pain  REQUESTING PHYSICIAN:  Arthur Blocker, MD [address] [place],  [place] [address]  Chief Complaint  Patient presents with  . Chest Pain  . Abnormal ECG    HPI  Arthur Sanders is a 59 y.o. male who presents to the office with a chief complaint of " chest pain." Patient's past medical history and cardiovascular risk factors include: Hypertension, hyperlipidemia, NIDDM Type II, GERD.  He is referred to the office at the request of Arthur Blocker, MD for evaluation of chest pain.  Patient is accompanied by his wife Vicente Males) at today's office visit.  Chest pain: Patient states that the discomfort has been going on for the last 1-2 months.  Is been progressive at times and he has gone to the emergency room department twice since June 2021 for similar symptoms.  The pain is located over the left anterior chest wall below the left nipple.  The intensity is 3 out of 10 in the time of evaluation at 120.  Intramedullary-like sensation, last for a 30 to 40 minutes, no improving or worsening factors.  The discomfort is not brought on by effort related activities and does not resolve with rest.  Associated symptoms include dizziness and tightness in the jaw at times.  Patient went to the ER on January 1st 2022.  High sensitive troponins were negative x2 and was sent home to follow-up outpatient.  No family history of premature coronary disease or sudden cardiac death.  FUNCTIONAL STATUS: Exercises three days a week (sit up, push up, planking).    ALLERGIES: Allergies  Allergen Reactions  . Egg [Eggs Or Egg-Derived Products]     MEDICATION LIST PRIOR TO VISIT: Current Meds  Medication Sig  . aspirin 81 MG chewable tablet Chew 81 mg by mouth daily.  . metFORMIN (GLUCOPHAGE) 500 MG tablet  Take by mouth.  . metoprolol tartrate (LOPRESSOR) 25 MG tablet Take 1 tablet (25 mg total) by mouth 2 (two) times daily for 10 days.  . rosuvastatin (CRESTOR) 5 MG tablet Take 5 mg by mouth daily.  Marland Kitchen triamterene-hydrochlorothiazide (MAXZIDE-25) 37.5-25 MG tablet Take 1 tablet by mouth daily.     PAST MEDICAL HISTORY: Past Medical History:  Diagnosis Date  . Allergy   . Diabetes mellitus without complication ([place])   . Hyperlipidemia   . Hypertension     PAST SURGICAL HISTORY: History reviewed. No pertinent surgical history.   FAMILY HISTORY: The patient family history includes Cancer (age of onset: 15) in his father; Diabetes in his father, mother, and sister; Hyperlipidemia in his sister; Hypertension in his father and mother; Stroke in his sister.  SOCIAL HISTORY:  The patient  reports that he has never smoked. He has never used smokeless tobacco. He reports current alcohol use. He reports that he does not use drugs.  REVIEW OF SYSTEMS: Review of Systems  Constitutional: Negative for chills and fever.  HENT: Negative for hoarse voice and nosebleeds.   Eyes: Negative for discharge, double vision and pain.  Cardiovascular: Positive for chest pain. Negative for claudication, dyspnea on exertion, leg swelling, near-syncope, orthopnea, palpitations, paroxysmal nocturnal dyspnea and syncope.  Respiratory: Positive for shortness of breath. Negative for hemoptysis.   Musculoskeletal: Negative for muscle cramps and myalgias.  Gastrointestinal: Negative for abdominal pain, constipation,  diarrhea, hematemesis, hematochezia, melena, nausea and vomiting.  Neurological: Negative for dizziness and light-headedness.    PHYSICAL EXAM: Vitals with BMI 01/20/2020 01/16/2020 01/16/2020  Height _0  - -  Weight 189 lbs - -  BMI 58.85 - -  Systolic 027 741 287  Diastolic 81 87 867  Pulse 68 62 68   CONSTITUTIONAL: Well-developed and well-nourished. No acute distress.  SKIN: Skin is warm and dry.  No rash noted. No cyanosis. No pallor. No jaundice HEAD: Normocephalic and atraumatic.  EYES: No scleral icterus MOUTH/THROAT: Moist oral membranes.  NECK: No JVD present. No thyromegaly noted. No carotid bruits  LYMPHATIC: No visible cervical adenopathy.  CHEST Normal respiratory effort. No intercostal retractions  LUNGS: Clear to auscultation bilaterally.  No stridor. No wheezes. No rales.  CARDIOVASCULAR: Regular rate and rhythm, positive S1-S2, no murmurs rubs or gallops appreciated ABDOMINAL: No apparent ascites.  EXTREMITIES: No peripheral edema, 2+ dorsalis pedis and posterior tibial pulses, warm to touch. HEMATOLOGIC: No significant bruising NEUROLOGIC: Oriented to person, place, and time. Nonfocal. Normal muscle tone.  PSYCHIATRIC: Normal mood and affect. Normal behavior. Cooperative  CARDIAC DATABASE: EKG: 01/20/2020: Normal sinus rhythm, 61 bpm, normal axis, T wave inversions in the inferior leads suggestive of possible ischemia, without underlying injury pattern.   Echocardiogram: 08/18/2010: LVEF 55-60%, no regional motion abnormalities, focal basal hypertrophy of the septum, grade 1 diastolic impairment, without any significant valvular heart disease.   Stress Testing: No results found for this or any previous visit from the past 1095 days.  Heart Catheterization: None  LABORATORY DATA: CBC Latest Ref Rng & Units 01/16/2020 06/17/2019 11/21/2016  WBC 4.0 - 10.5 K/uL 6.9 6.1 5.6  Hemoglobin 13.0 - 17.0 g/dL 12.9(L) 14.0 13.1  Hematocrit 39.0 - 52.0 % 39.3 42.0 39.3  Platelets 150 - 400 K/uL 242 212 198    CMP Latest Ref Rng & Units 01/16/2020 06/17/2019 11/21/2016  Glucose 70 - 99 mg/dL 179(H) 251(H) 150(H)  BUN 6 - 20 mg/dL _1 Creatinine 0.61 - 1.24 mg/dL 1.40(H) 1.12 1.01  Sodium 135 - 145 mmol/L 133(L) 133(L) 135  Potassium 3.5 - 5.1 mmol/L 3.7 4.0 3.7  Chloride 98 - 111 mmol/L 96(L) 97(L) 101  CO2 22 - 32 mmol/L _2 Calcium 8.9 - 10.3 mg/dL 9.7 9.4 9.5   Total Protein 6.0 - 8.3 g/dL - - -  Total Bilirubin 0.3 - 1.2 mg/dL - - -  Alkaline Phos 39 - 117 U/L - - -  AST 0 - 37 U/L - - -  ALT 0 - 53 U/L - - -    Lipid Panel     Component Value Date/Time   CHOL 201 (H) 09/11/2010 0830   TRIG 70.0 09/11/2010 0830   HDL 48.90 09/11/2010 0830   CHOLHDL 4 09/11/2010 0830   VLDL 14.0 09/11/2010 0830   LDLCALC 140 (H) 06/12/2006 0902   LDLDIRECT 139.4 09/11/2010 0830    No components found for: NTPROBNP No results for input(s): PROBNP in the last 8760 hours. No results for input(s): TSH in the last 8760 hours.  BMP Recent Labs    06/17/19 1727 01/16/20 0429  NA 133* 133*  K 4.0 3.7  CL 97* 96*  CO2 26 26  GLUCOSE 251* 179*  BUN 13 17  CREATININE 1.12 1.40*  CALCIUM 9.4 9.7  GFRNONAA >60 59*  GFRAA >60  --     HEMOGLOBIN A1C No results found for: HGBA1C, MPG  External Labs: Collected: 12/04/2019,  outside labs received from PCP Creatinine 1.42 mg/dL. eGFR: 63 mL/min per 1.73 m Potassium 4.2 Lipid profile: Total cholesterol 153, triglycerides 97, HDL 41, LDL 92, non-HDL 111 AST 20, ALT 18 Hemoglobin A1c: 8.5  IMPRESSION:    ICD-10-CM   1. Precordial pain  R07.2 EKG 12-Lead    CT CORONARY MORPH W/CTA COR W/SCORE W/CA W/CM &/OR WO/CM    CT CORONARY FRACTIONAL FLOW RESERVE DATA PREP    CT CORONARY FRACTIONAL FLOW RESERVE FLUID ANALYSIS    PCV ECHOCARDIOGRAM COMPLETE    metoprolol tartrate (LOPRESSOR) 25 MG tablet  2. Non-insulin dependent type 2 diabetes mellitus ([place])  E11.9   3. Mixed hyperlipidemia  E78.2   4. Benign hypertension  I10      RECOMMENDATIONS:  DENTON DERKS is a 60 y.o. male whose past medical history and cardiac risk factors include: Hypertension, hyperlipidemia, NIDDM Type II, GERD.  Precordial pain: Patient symptoms of chest pain appear to be atypical in nature; however, he has been to the hospital approximately twice since the initiation of discomfort. He has multiple cardiovascular  risk factors including hypertension, diabetes, and hyperlipidemia.  And therefore an ischemic evaluation is warranted. His estimated 10-year risk of ASCVD is 26.3%. Echocardiogram will be ordered to evaluate for structural heart disease and left ventricular systolic function. Coronary CTA to evaluate for coronary artery calcification and CAD. Continue current medical therapy. We will prescribe Lopressor 25 mg p.o. twice daily prior to his coronary CTA. Patient has already had blood work on January 16, 2020. I have asked him to increase his fluid intake by 2 glasses of water several days prior to the CT scan.  To hold Maxide the day after the CT scan given his underlying renal insufficiency. Plan of care discussed with both the patient and his wife at today's office visit. He is asked to seek medical attention by going to the closest ER via EMS if his symptoms increase in intensity, frequency, duration and or has typical discomfort as outlined during today's encounter.  Both patient and wife verbalized understanding  Non-insulin-dependent diabetes mellitus type 2: Outside labs independently reviewed and summarized findings noted above.  Hemoglobin A1c currently not at goal. Educated on importance of glycemic control given his other cardiovascular risk factors. Currently managed by primary care provider.  Hypertension: Office blood pressure is currently not at goal.  However, patient states that his home blood pressures are between 130-140 mmHg. Given the fact that he is diabetic would recommend a goal systolic blood pressure around 130 mmHg. Would recommend considering the addition of ARB given his non-insulin-dependent diabetes. Educated on importance of a low-salt diet. Currently managed by primary care provider.  Hyperlipidemia, mixed: Currently on rosuvastatin 5 mg p.o. nightly. Currently managed by PCP. LDL currently not at goal.  Would recommend an LDL goal of less than 70 mg/dL in the  setting of diabetes.  FINAL MEDICATION LIST END OF ENCOUNTER: Meds ordered this encounter  Medications  . metoprolol tartrate (LOPRESSOR) 25 MG tablet    Sig: Take 1 tablet (25 mg total) by mouth 2 (two) times daily for 10 days.    Dispense:  20 tablet    Refill:  0     Current Outpatient Medications:  .  aspirin 81 MG chewable tablet, Chew 81 mg by mouth daily., Disp: , Rfl:  .  metFORMIN (GLUCOPHAGE) 500 MG tablet, Take by mouth., Disp: , Rfl:  .  metoprolol tartrate (LOPRESSOR) 25 MG tablet, Take 1 tablet (25 mg total)  by mouth 2 (two) times daily for 10 days., Disp: 20 tablet, Rfl: 0 .  rosuvastatin (CRESTOR) 5 MG tablet, Take 5 mg by mouth daily., Disp: , Rfl:  .  triamterene-hydrochlorothiazide (MAXZIDE-25) 37.5-25 MG tablet, Take 1 tablet by mouth daily., Disp: , Rfl:   Orders Placed This Encounter  Procedures  . CT CORONARY MORPH W/CTA COR W/SCORE W/CA W/CM &/OR WO/CM  . CT CORONARY FRACTIONAL FLOW RESERVE DATA PREP  . CT CORONARY FRACTIONAL FLOW RESERVE FLUID ANALYSIS  . EKG 12-Lead  . PCV ECHOCARDIOGRAM COMPLETE   There are no Patient Instructions on file for this visit.   --Continue cardiac medications as reconciled in final medication list. --Return in about 4 weeks (around 02/17/2020) for Follow up, Chest pain. Or sooner if needed. --Continue follow-up with your primary care physician regarding the management of your other chronic comorbid conditions.  Patient's questions and concerns were addressed to his satisfaction. He voices understanding of the instructions provided during this encounter.   This note was created using a voice recognition software as a result there may be grammatical errors inadvertently enclosed that do not reflect the nature of this encounter. Every attempt is made to correct such errors.  As part of this consultation reviewed outside records and outside labs provided by PCP, discussed disease management, ordered diagnostic testing, discussed  management of risk factors, and coordination of care.  Rex Kras, [place], [facility]  Pager: [phone] Office: [phone]

## 2020-01-21 ENCOUNTER — Other Ambulatory Visit: Payer: Self-pay | Admitting: Internal Medicine

## 2020-01-21 DIAGNOSIS — R1012 Left upper quadrant pain: Secondary | ICD-10-CM

## 2020-01-25 ENCOUNTER — Telehealth (HOSPITAL_COMMUNITY): Payer: Self-pay | Admitting: Emergency Medicine

## 2020-01-25 NOTE — Telephone Encounter (Signed)
Attempted to call patient regarding upcoming cardiac CT appointment. °Left message on voicemail with name and callback number °Sky Borboa RN Navigator Cardiac Imaging °[facility] °[phone] Office °[phone] Cell ° °

## 2020-01-26 ENCOUNTER — Encounter (HOSPITAL_COMMUNITY): Payer: Self-pay

## 2020-01-26 ENCOUNTER — Encounter: Payer: 59 | Admitting: *Deleted

## 2020-01-26 ENCOUNTER — Ambulatory Visit (HOSPITAL_COMMUNITY)
Admission: RE | Admit: 2020-01-26 | Discharge: 2020-01-26 | Disposition: A | Discharge status: Home / Self Care | Payer: 59 | Source: Ambulatory Visit | Attending: Internal Medicine | Admitting: Internal Medicine

## 2020-01-26 ENCOUNTER — Other Ambulatory Visit: Payer: Self-pay

## 2020-01-26 DIAGNOSIS — Z006 Encounter for examination for normal comparison and control in clinical research program: Secondary | ICD-10-CM

## 2020-01-26 DIAGNOSIS — R072 Precordial pain: Secondary | ICD-10-CM | POA: Insufficient documentation

## 2020-01-26 MED ORDER — NITROGLYCERIN 0.4 MG SL SUBL
SUBLINGUAL_TABLET | SUBLINGUAL | Status: AC
  Administered 2020-01-26: 0.8 mg via SUBLINGUAL
  Filled 2020-01-26: qty 2

## 2020-01-26 MED ORDER — IOHEXOL 350 MG/ML SOLN
80.0000 mL | Freq: Once | INTRAVENOUS | Status: AC | PRN
  Administered 2020-01-26: 80 mL via INTRAVENOUS

## 2020-01-26 MED ORDER — NITROGLYCERIN 0.4 MG SL SUBL: 0.8000 mg | SUBLINGUAL_TABLET | Freq: Once | SUBLINGUAL | Status: AC

## 2020-01-26 NOTE — Research (Signed)
Subject Name: Arthur Sanders  Subject met inclusion and exclusion criteria.  The informed consent form, study requirements and expectations were reviewed with the subject and questions and concerns were addressed prior to the signing of the consent form.  The subject verbalized understanding of the trial requirements.  The subject agreed to participate in the IDENTIFY trial and signed the informed consent at 1434 on 01/26/20  The informed consent was obtained prior to performance of any protocol-specific procedures for the subject.  A copy of the signed informed consent was given to the subject and a copy was placed in the subject's medical record.   [name]

## 2020-01-28 ENCOUNTER — Ambulatory Visit: Payer: 59

## 2020-01-28 ENCOUNTER — Ambulatory Visit: Payer: 59 | Admitting: Cardiology

## 2020-01-28 ENCOUNTER — Other Ambulatory Visit: Payer: Self-pay

## 2020-01-28 ENCOUNTER — Encounter: Payer: Self-pay | Admitting: Cardiology

## 2020-01-28 VITALS — BP 122/86 | HR 63 | Temp 98.4°F | Resp 16 | Ht 73.0 in | Wt 189.4 lb

## 2020-01-28 DIAGNOSIS — R072 Precordial pain: Secondary | ICD-10-CM

## 2020-01-28 DIAGNOSIS — I10 Essential (primary) hypertension: Secondary | ICD-10-CM

## 2020-01-28 DIAGNOSIS — E782 Mixed hyperlipidemia: Secondary | ICD-10-CM

## 2020-01-28 DIAGNOSIS — I2510 Atherosclerotic heart disease of native coronary artery without angina pectoris: Secondary | ICD-10-CM

## 2020-01-28 DIAGNOSIS — E119 Type 2 diabetes mellitus without complications: Secondary | ICD-10-CM

## 2020-01-28 DIAGNOSIS — I2584 Coronary atherosclerosis due to calcified coronary lesion: Secondary | ICD-10-CM

## 2020-01-28 MED ORDER — ROSUVASTATIN CALCIUM 40 MG PO TABS
40.0000 mg | ORAL_TABLET | Freq: Every day | ORAL | 0 refills | Status: DC
Start: 2020-01-28 — End: 2020-10-28

## 2020-01-28 MED ORDER — METOPROLOL SUCCINATE ER 50 MG PO TB24: 50.0000 mg | ORAL_TABLET | Freq: Every morning | ORAL | 0 refills | Status: DC

## 2020-01-28 MED ORDER — NITROGLYCERIN 0.4 MG SL SUBL: 0.4000 mg | SUBLINGUAL_TABLET | SUBLINGUAL | 0 refills | Status: DC | PRN

## 2020-01-28 NOTE — H&P (View-Only) (Signed)
Date:  01/28/2020   ID:  Arthur Sanders, DOB 1960/04/08, MRN [id number]  PCP:  Rogers Blocker, MD  Cardiologist:  Rex Kras, DO, [facility] (established care 01/20/2020)  Date: 01/28/20 Last Office Visit: 01/20/2020  Chief Complaint  Patient presents with   Follow-up    Chest Pain    Results    Review CCTA    HPI  Arthur Sanders is a 60 y.o. male who presents to the office with a chief complaint of " reevaluation of chest pain and review test results." Patient's past medical history and cardiovascular risk factors include: Moderate coronary artery calcification with calcified coronary atherosclerosis, Hypertension, hyperlipidemia, NIDDM Type II, GERD.  He is referred to the office at the request of Rogers Blocker, MD for evaluation of chest pain.  Patient is accompanied by his wife Arthur Sanders) at today's office visit.  His symptoms were consistent with atypical chest pain but given his multiple cardiovascular risk factors and 10-year estimated risk of ASCVD to be greater than 20% he underwent an ischemic evaluation since last visit.  Echocardiogram notes preserved LVEF without any significant valvular heart disease.  He did undergo coronary CTA which notes moderate disease in the proximal LAD and LCx distribution.  Since last visit patient states that he continues to have atypical symptoms but the intensity, frequency, and duration remained stable.  He denies any typical anginal chest pain.  No family history of premature coronary disease or sudden cardiac death.  FUNCTIONAL STATUS: Exercises three days a week (sit up, push up, planking).    ALLERGIES: Allergies  Allergen Reactions   Egg [Eggs Or Egg-Derived Products]     MEDICATION LIST PRIOR TO VISIT: Current Meds  Medication Sig   aspirin 81 MG chewable tablet Chew 81 mg by mouth daily.   metFORMIN (GLUCOPHAGE) 500 MG tablet Take 1,000 mg by mouth.   metoprolol succinate (TOPROL XL) 50 MG 24 hr tablet Take 1 tablet (50 mg  total) by mouth in the morning.   nitroGLYCERIN (NITROSTAT) 0.4 MG SL tablet Place 1 tablet (0.4 mg total) under the tongue every 5 (five) minutes as needed for chest pain. If you require more than two tablets five minutes apart go to the nearest ER via EMS.   omeprazole (PRILOSEC) 40 MG capsule Take 40 mg by mouth daily.   triamterene-hydrochlorothiazide (MAXZIDE-25) 37.5-25 MG tablet Take 1 tablet by mouth daily.   [DISCONTINUED] metoprolol tartrate (LOPRESSOR) 25 MG tablet Take 1 tablet (25 mg total) by mouth 2 (two) times daily for 10 days.   [DISCONTINUED] rosuvastatin (CRESTOR) 5 MG tablet Take 5 mg by mouth daily.     PAST MEDICAL HISTORY: Past Medical History:  Diagnosis Date   Allergy    Coronary artery disease    Diabetes mellitus without complication ([place])    Hyperlipidemia    Hypertension     PAST SURGICAL HISTORY: History reviewed. No pertinent surgical history.   FAMILY HISTORY: The patient family history includes Cancer (age of onset: 86) in his father; Diabetes in his father, mother, and sister; Hyperlipidemia in his sister; Hypertension in his father and mother; Stroke in his sister.  SOCIAL HISTORY:  The patient  reports that he has never smoked. He has never used smokeless tobacco. He reports current alcohol use. He reports that he does not use drugs.  REVIEW OF SYSTEMS: Review of Systems  Constitutional: Negative for chills and fever.  HENT: Negative for hoarse voice and nosebleeds.   Eyes: Negative for discharge,  double vision and pain.  Cardiovascular: Positive for chest pain. Negative for claudication, dyspnea on exertion, leg swelling, near-syncope, orthopnea, palpitations, paroxysmal nocturnal dyspnea and syncope.  Respiratory: Positive for shortness of breath. Negative for hemoptysis.   Musculoskeletal: Negative for muscle cramps and myalgias.  Gastrointestinal: Negative for abdominal pain, constipation, diarrhea, hematemesis, hematochezia,  melena, nausea and vomiting.  Neurological: Negative for dizziness and light-headedness.    PHYSICAL EXAM: Vitals with BMI 01/28/2020 01/26/2020 01/26/2020  Height _0  - -  Weight 189 lbs 6 oz - -  BMI 20.35 - -  Systolic 597 416 384  Diastolic 86 79 99  Pulse 63 66 65   CONSTITUTIONAL: Well-developed and well-nourished. No acute distress.  SKIN: Skin is warm and dry. No rash noted. No cyanosis. No pallor. No jaundice HEAD: Normocephalic and atraumatic.  EYES: No scleral icterus MOUTH/THROAT: Moist oral membranes.  NECK: No JVD present. No thyromegaly noted. No carotid bruits  LYMPHATIC: No visible cervical adenopathy.  CHEST Normal respiratory effort. No intercostal retractions  LUNGS: Clear to auscultation bilaterally.  No stridor. No wheezes. No rales.  CARDIOVASCULAR: Regular rate and rhythm, positive S1-S2, no murmurs rubs or gallops appreciated ABDOMINAL: No apparent ascites.  EXTREMITIES: No peripheral edema, 2+ dorsalis pedis and posterior tibial pulses, warm to touch. HEMATOLOGIC: No significant bruising NEUROLOGIC: Oriented to person, place, and time. Nonfocal. Normal muscle tone.  PSYCHIATRIC: Normal mood and affect. Normal behavior. Cooperative  CARDIAC DATABASE: EKG: 01/20/2020: Normal sinus rhythm, 61 bpm, normal axis, T wave inversions in the inferior leads suggestive of possible ischemia, without underlying injury pattern.   Echocardiogram: 01/28/2020:  Normal LV systolic function with visual EF 60-65%.  Left ventricle cavity is normal in size. Normal global wall motion. Normal diastolic filling pattern, normal LAP.  Mild tricuspid regurgitation. No evidence of pulmonary hypertension.  No other significant valvular heart disease.  No prior study for comparison.   Stress Testing: No results found for this or any previous visit from the past 1095 days.  Coronary CTA: 01/26/2020  1. Coronary calcium score of 371. This was 96th percentile for age and sex matched  control. 2. Normal coronary origin with right dominance. 3. CAD-RADS = 3. Moderate stenosis within proximal LAD, Moderate stenosis within proximal LCx. Mild stenosis within proximal RCA. 4. Study will not be sent for CT-FFR due to artifact from frequent PVCs and misregistration. Consider invasive coronary angiography, if clinically indicated. Non-cardiac portion: Negative over-read examination  Heart Catheterization: None  LABORATORY DATA: CBC Latest Ref Rng & Units 01/16/2020 06/17/2019 11/21/2016  WBC 4.0 - 10.5 K/uL 6.9 6.1 5.6  Hemoglobin 13.0 - 17.0 g/dL 12.9(L) 14.0 13.1  Hematocrit 39.0 - 52.0 % 39.3 42.0 39.3  Platelets 150 - 400 K/uL 242 212 198    CMP Latest Ref Rng & Units 01/16/2020 06/17/2019 11/21/2016  Glucose 70 - 99 mg/dL 179(H) 251(H) 150(H)  BUN 6 - 20 mg/dL _1 Creatinine 0.61 - 1.24 mg/dL 1.40(H) 1.12 1.01  Sodium 135 - 145 mmol/L 133(L) 133(L) 135  Potassium 3.5 - 5.1 mmol/L 3.7 4.0 3.7  Chloride 98 - 111 mmol/L 96(L) 97(L) 101  CO2 22 - 32 mmol/L _2 Calcium 8.9 - 10.3 mg/dL 9.7 9.4 9.5  Total Protein 6.0 - 8.3 g/dL - - -  Total Bilirubin 0.3 - 1.2 mg/dL - - -  Alkaline Phos 39 - 117 U/L - - -  AST 0 - 37 U/L - - -  ALT 0 - 53 U/L - - -  Lipid Panel     Component Value Date/Time   CHOL 201 (H) 09/11/2010 0830   TRIG 70.0 09/11/2010 0830   HDL 48.90 09/11/2010 0830   CHOLHDL 4 09/11/2010 0830   VLDL 14.0 09/11/2010 0830   LDLCALC 140 (H) 06/12/2006 0902   LDLDIRECT 139.4 09/11/2010 0830    No components found for: NTPROBNP No results for input(s): PROBNP in the last 8760 hours. No results for input(s): TSH in the last 8760 hours.  BMP Recent Labs    06/17/19 1727 01/16/20 0429  NA 133* 133*  K 4.0 3.7  CL 97* 96*  CO2 26 26  GLUCOSE 251* 179*  BUN 13 17  CREATININE 1.12 1.40*  CALCIUM 9.4 9.7  GFRNONAA >60 59*  GFRAA >60  --     HEMOGLOBIN A1C No results found for: HGBA1C, MPG  External Labs: Collected: 12/04/2019,  outside labs received from PCP Creatinine 1.42 mg/dL. eGFR: 63 mL/min per 1.73 m Potassium 4.2 Lipid profile: Total cholesterol 153, triglycerides 97, HDL 41, LDL 92, non-HDL 111 AST 20, ALT 18 Hemoglobin A1c: 8.5  IMPRESSION:    ICD-10-CM   1. Precordial pain  X54.0 Basic metabolic panel    Magnesium    SARS-COV-2 RNA,(COVID-19) QUAL NAAT    nitroGLYCERIN (NITROSTAT) 0.4 MG SL tablet  2. Coronary atherosclerosis due to calcified coronary lesion  I25.10 rosuvastatin (CRESTOR) 40 MG tablet   I25.84 metoprolol succinate (TOPROL XL) 50 MG 24 hr tablet    nitroGLYCERIN (NITROSTAT) 0.4 MG SL tablet  3. Mixed hyperlipidemia  E78.2 rosuvastatin (CRESTOR) 40 MG tablet    Lipid Panel With LDL/HDL Ratio    LDL cholesterol, direct  4. Non-insulin dependent type 2 diabetes mellitus ([place])  E11.9   5. Benign hypertension  I10      RECOMMENDATIONS:  Arthur Sanders is a 60 y.o. male whose past medical history and cardiac risk factors include: Hypertension, hyperlipidemia, NIDDM Type II, GERD.  Coronary atherosclerosis due to calcified coronary lesion:  Moderate coronary artery calcification with moderate disease in the LCx and LAD distribution per coronary CTA.  The study was not suitable for CT FFR due to artifact present.  Given his elevated 10-year risk of estimated ASCVD to be greater than 20%, atypical chest pain in the setting of diabetes, and moderate CAD per coronary CTA that shared decision was to proceed with invasive angiography to evaluate for obstructive disease.  The procedure of left heart catheterization with possible intervention was explained to the patient in detail.   The indication, alternatives, risks and benefits were reviewed.   Complications include but not limited to bleeding, infection, vascular injury, stroke, myocardial infection, arrhythmia, kidney injury, radiation-related injury in the case of prolonged fluoroscopy use, emergency cardiac surgery, and  death. The patient understands the risks of serious complication is 1-2 in 0867 with diagnostic cardiac cath and 1-2% or less with angioplasty/stenting.   I have discussed in particular detail the possibility of acute renal failure after coronary angiography particularly if PCI is necessary.  I have described that it is possible patient may need short-term dialysis and the less likely may also require long-term dialysis depending on renal recovery.  I have asked for consent to proceed with coronary angiography understanding the risks of potentially needing dialysis, as well as the risks as stated above.    The patient and his wife voices understanding and provides verbal feedback and wishes to proceed with coronary angiography with possible PCI.  Precordial pain:   Plan as  noted above.   Transition to Toprol XL 24m po qday   Sublingual nitroglycerin tablets to use on a as needed basis.  Medication profile discussed.    He is asked to seek medical attention by going to the closest ER via EMS if his symptoms increase in intensity, frequency, duration and or has typical discomfort as outlined during today's encounter.  Both patient and wife verbalized understanding  Non-insulin-dependent diabetes mellitus type 2:  Hemoglobin A1c currently not at goal.  Educated on importance of glycemic control given his other cardiovascular risk factors.  Currently managed by primary care provider.  Hypertension:  Office blood pressure is currently at goal.    Given the fact that he is diabetic would recommend a goal systolic blood pressure around 130 mmHg.  Would recommend considering the addition of ARB given his non-insulin-dependent diabetes.  Educated on importance of a low-salt diet.  Currently managed by primary care provider.  Hyperlipidemia, mixed:  Increase rosuvastatin to 40 mg p.o. nightly.  LDL currently not at goal.  Would recommend an LDL goal of less than 70 mg/dL in the setting  of diabetes.  Repeat fast lipid profile along with pre-cath labs.   FINAL MEDICATION LIST END OF ENCOUNTER: Meds ordered this encounter  Medications   rosuvastatin (CRESTOR) 40 MG tablet    Sig: Take 1 tablet (40 mg total) by mouth at bedtime.    Dispense:  90 tablet    Refill:  0   metoprolol succinate (TOPROL XL) 50 MG 24 hr tablet    Sig: Take 1 tablet (50 mg total) by mouth in the morning.    Dispense:  90 tablet    Refill:  0   nitroGLYCERIN (NITROSTAT) 0.4 MG SL tablet    Sig: Place 1 tablet (0.4 mg total) under the tongue every 5 (five) minutes as needed for chest pain. If you require more than two tablets five minutes apart go to the nearest ER via EMS.    Dispense:  30 tablet    Refill:  0     Current Outpatient Medications:    aspirin 81 MG chewable tablet, Chew 81 mg by mouth daily., Disp: , Rfl:    metFORMIN (GLUCOPHAGE) 500 MG tablet, Take 1,000 mg by mouth., Disp: , Rfl:    metoprolol succinate (TOPROL XL) 50 MG 24 hr tablet, Take 1 tablet (50 mg total) by mouth in the morning., Disp: 90 tablet, Rfl: 0   nitroGLYCERIN (NITROSTAT) 0.4 MG SL tablet, Place 1 tablet (0.4 mg total) under the tongue every 5 (five) minutes as needed for chest pain. If you require more than two tablets five minutes apart go to the nearest ER via EMS., Disp: 30 tablet, Rfl: 0   omeprazole (PRILOSEC) 40 MG capsule, Take 40 mg by mouth daily., Disp: , Rfl:    triamterene-hydrochlorothiazide (MAXZIDE-25) 37.5-25 MG tablet, Take 1 tablet by mouth daily., Disp: , Rfl:    rosuvastatin (CRESTOR) 40 MG tablet, Take 1 tablet (40 mg total) by mouth at bedtime., Disp: 90 tablet, Rfl: 0  Orders Placed This Encounter  Procedures   SARS-COV-2 RNA,(COVID-19) QUAL NAAT   Lipid Panel With LDL/HDL Ratio   LDL cholesterol, direct   Basic metabolic panel   Magnesium   There are no Patient Instructions on file for this visit.   --Continue cardiac medications as reconciled in final medication  list. --Return in about 4 weeks (around 02/25/2020) for Post heart catheterization. Or sooner if needed. --Continue follow-up with your primary care  physician regarding the management of your other chronic comorbid conditions.  Patient's questions and concerns were addressed to his satisfaction. He voices understanding of the instructions provided during this encounter.   This note was created using a voice recognition software as a result there may be grammatical errors inadvertently enclosed that do not reflect the nature of this encounter. Every attempt is made to correct such errors.  As part of this consultation reviewed outside records and outside labs provided by PCP, discussed disease management, ordered diagnostic testing, discussed management of risk factors, and coordination of care.  Rex Kras, [place], [facility]  Pager: [phone] Office: [phone]

## 2020-01-28 NOTE — Progress Notes (Signed)
° °Date:  01/28/2020  ° °ID:  Arthur Sanders, DOB 11/15/1960, MRN [id number] ° °PCP:  Dean, Eric L, MD  °Cardiologist:  Zayden Hahne, DO, FACC (established care 01/20/2020) ° °Date: 01/28/20 °Last Office Visit: 01/20/2020 ° °Chief Complaint  °Patient presents with  °• Follow-up  °  Chest Pain   °• Results  °  Review CCTA  ° ° °HPI  °Arthur Sanders is a 60 y.o. male who presents to the office with a chief complaint of " reevaluation of chest pain and review test results." Patient's past medical history and cardiovascular risk factors include: Moderate coronary artery calcification with calcified coronary atherosclerosis, Hypertension, hyperlipidemia, NIDDM Type II, GERD. ° °He is referred to the office at the request of Dean, Eric L, MD for evaluation of chest pain. ° °Patient is accompanied by his wife (Anna) at today's office visit. ° °His symptoms were consistent with atypical chest pain but given his multiple cardiovascular risk factors and 10-year estimated risk of ASCVD to be greater than 20% he underwent an ischemic evaluation since last visit.  Echocardiogram notes preserved LVEF without any significant valvular heart disease.  He did undergo coronary CTA which notes moderate disease in the proximal LAD and LCx distribution.  Since last visit patient states that he continues to have atypical symptoms but the intensity, frequency, and duration remained stable.  He denies any typical anginal chest pain. ° °No family history of premature coronary disease or sudden cardiac death. ° °FUNCTIONAL STATUS: °Exercises three days a week (sit up, push up, planking).   ° °ALLERGIES: °Allergies  °Allergen Reactions  °• Egg [Eggs Or Egg-Derived Products]   ° ° °MEDICATION LIST PRIOR TO VISIT: °Current Meds  °Medication Sig  °• aspirin 81 MG chewable tablet Chew 81 mg by mouth daily.  °• metFORMIN (GLUCOPHAGE) 500 MG tablet Take 1,000 mg by mouth.  °• metoprolol succinate (TOPROL XL) 50 MG 24 hr tablet Take 1 tablet (50 mg  total) by mouth in the morning.  °• nitroGLYCERIN (NITROSTAT) 0.4 MG SL tablet Place 1 tablet (0.4 mg total) under the tongue every 5 (five) minutes as needed for chest pain. If you require more than two tablets five minutes apart go to the nearest ER via EMS.  °• omeprazole (PRILOSEC) 40 MG capsule Take 40 mg by mouth daily.  °• triamterene-hydrochlorothiazide (MAXZIDE-25) 37.5-25 MG tablet Take 1 tablet by mouth daily.  °• [DISCONTINUED] metoprolol tartrate (LOPRESSOR) 25 MG tablet Take 1 tablet (25 mg total) by mouth 2 (two) times daily for 10 days.  °• [DISCONTINUED] rosuvastatin (CRESTOR) 5 MG tablet Take 5 mg by mouth daily.  °  ° °PAST MEDICAL HISTORY: °Past Medical History:  °Diagnosis Date  °• Allergy   °• Coronary artery disease   °• Diabetes mellitus without complication (HCC)   °• Hyperlipidemia   °• Hypertension   ° ° °PAST SURGICAL HISTORY: °History reviewed. No pertinent surgical history.  ° °FAMILY HISTORY: °The patient family history includes Cancer (age of onset: 70) in his father; Diabetes in his father, mother, and sister; Hyperlipidemia in his sister; Hypertension in his father and mother; Stroke in his sister. ° °SOCIAL HISTORY:  °The patient  reports that he has never smoked. He has never used smokeless tobacco. He reports current alcohol use. He reports that he does not use drugs. ° °REVIEW OF SYSTEMS: °Review of Systems  °Constitutional: Negative for chills and fever.  °HENT: Negative for hoarse voice and nosebleeds.   °Eyes: Negative for discharge,   double vision and pain.  °Cardiovascular: Positive for chest pain. Negative for claudication, dyspnea on exertion, leg swelling, near-syncope, orthopnea, palpitations, paroxysmal nocturnal dyspnea and syncope.  °Respiratory: Positive for shortness of breath. Negative for hemoptysis.   °Musculoskeletal: Negative for muscle cramps and myalgias.  °Gastrointestinal: Negative for abdominal pain, constipation, diarrhea, hematemesis, hematochezia,  melena, nausea and vomiting.  °Neurological: Negative for dizziness and light-headedness.  ° ° °PHYSICAL EXAM: °Vitals with BMI 01/28/2020 01/26/2020 01/26/2020  °Height 6' 1" - -  °Weight 189 lbs 6 oz - -  °BMI 24.99 - -  °Systolic 122 128 145  °Diastolic 86 79 99  °Pulse 63 66 65  ° °CONSTITUTIONAL: Well-developed and well-nourished. No acute distress.  °SKIN: Skin is warm and dry. No rash noted. No cyanosis. No pallor. No jaundice °HEAD: Normocephalic and atraumatic.  °EYES: No scleral icterus °MOUTH/THROAT: Moist oral membranes.  °NECK: No JVD present. No thyromegaly noted. No carotid bruits  °LYMPHATIC: No visible cervical adenopathy.  °CHEST Normal respiratory effort. No intercostal retractions  °LUNGS: Clear to auscultation bilaterally.  No stridor. No wheezes. No rales.  °CARDIOVASCULAR: Regular rate and rhythm, positive S1-S2, no murmurs rubs or gallops appreciated °ABDOMINAL: No apparent ascites.  °EXTREMITIES: No peripheral edema, 2+ dorsalis pedis and posterior tibial pulses, warm to touch. °HEMATOLOGIC: No significant bruising °NEUROLOGIC: Oriented to person, place, and time. Nonfocal. Normal muscle tone.  °PSYCHIATRIC: Normal mood and affect. Normal behavior. Cooperative ° °CARDIAC DATABASE: °EKG: °01/20/2020: Normal sinus rhythm, 61 bpm, normal axis, T wave inversions in the inferior leads suggestive of possible ischemia, without underlying injury pattern.  ° °Echocardiogram: °01/28/2020:  °Normal LV systolic function with visual EF 60-65%.  °Left ventricle cavity is normal in size. Normal global wall motion. Normal diastolic filling pattern, normal LAP.  °Mild tricuspid regurgitation. No evidence of pulmonary hypertension.  °No other significant valvular heart disease.  °No prior study for comparison. °  °Stress Testing: °No results found for this or any previous visit from the past 1095 days. ° °Coronary CTA: °01/26/2020  °1. Coronary calcium score of 371. This was 96th percentile for age and sex matched  control. °2. Normal coronary origin with right dominance. °3. CAD-RADS = 3. Moderate stenosis within proximal LAD, Moderate stenosis within proximal LCx. Mild stenosis within proximal RCA. °4. Study will not be sent for CT-FFR due to artifact from frequent PVCs and misregistration. Consider invasive coronary angiography, if clinically indicated. °Non-cardiac portion: Negative over-read examination ° °Heart Catheterization: °None ° °LABORATORY DATA: °CBC Latest Ref Rng & Units 01/16/2020 06/17/2019 11/21/2016  °WBC 4.0 - 10.5 K/uL 6.9 6.1 5.6  °Hemoglobin 13.0 - 17.0 g/dL 12.9(L) 14.0 13.1  °Hematocrit 39.0 - 52.0 % 39.3 42.0 39.3  °Platelets 150 - 400 K/uL 242 212 198  ° ° °CMP Latest Ref Rng & Units 01/16/2020 06/17/2019 11/21/2016  °Glucose 70 - 99 mg/dL 179(H) 251(H) 150(H)  °BUN 6 - 20 mg/dL 17 13 15  °Creatinine 0.61 - 1.24 mg/dL 1.40(H) 1.12 1.01  °Sodium 135 - 145 mmol/L 133(L) 133(L) 135  °Potassium 3.5 - 5.1 mmol/L 3.7 4.0 3.7  °Chloride 98 - 111 mmol/L 96(L) 97(L) 101  °CO2 22 - 32 mmol/L 26 26 27  °Calcium 8.9 - 10.3 mg/dL 9.7 9.4 9.5  °Total Protein 6.0 - 8.3 g/dL - - -  °Total Bilirubin 0.3 - 1.2 mg/dL - - -  °Alkaline Phos 39 - 117 U/L - - -  °AST 0 - 37 U/L - - -  °ALT 0 - 53 U/L - - -  ° ° °  Lipid Panel  °   °Component Value Date/Time  ° CHOL 201 (H) 09/11/2010 0830  ° TRIG 70.0 09/11/2010 0830  ° HDL 48.90 09/11/2010 0830  ° CHOLHDL 4 09/11/2010 0830  ° VLDL 14.0 09/11/2010 0830  ° LDLCALC 140 (H) 06/12/2006 0902  ° LDLDIRECT 139.4 09/11/2010 0830  ° ° °No components found for: NTPROBNP °No results for input(s): PROBNP in the last 8760 hours. °No results for input(s): TSH in the last 8760 hours. ° °BMP °Recent Labs  °  06/17/19 °1727 01/16/20 °0429  °NA 133* 133*  °K 4.0 3.7  °CL 97* 96*  °CO2 26 26  °GLUCOSE 251* 179*  °BUN 13 17  °CREATININE 1.12 1.40*  °CALCIUM 9.4 9.7  °GFRNONAA >60 59*  °GFRAA >60  --   ° ° °HEMOGLOBIN A1C °No results found for: HGBA1C, MPG ° °External Labs: °Collected: 12/04/2019,  outside labs received from PCP °Creatinine 1.42 mg/dL. °eGFR: 63 mL/min per 1.73 m² °Potassium 4.2 °Lipid profile: Total cholesterol 153, triglycerides 97, HDL 41, LDL 92, non-HDL 111 °AST 20, ALT 18 °Hemoglobin A1c: 8.5 ° °IMPRESSION: ° °  ICD-10-CM   °1. Precordial pain  R07.2 Basic metabolic panel  °  Magnesium  °  SARS-COV-2 RNA,(COVID-19) QUAL NAAT  °  nitroGLYCERIN (NITROSTAT) 0.4 MG SL tablet  °2. Coronary atherosclerosis due to calcified coronary lesion  I25.10 rosuvastatin (CRESTOR) 40 MG tablet  ° I25.84 metoprolol succinate (TOPROL XL) 50 MG 24 hr tablet  °  nitroGLYCERIN (NITROSTAT) 0.4 MG SL tablet  °3. Mixed hyperlipidemia  E78.2 rosuvastatin (CRESTOR) 40 MG tablet  °  Lipid Panel With LDL/HDL Ratio  °  LDL cholesterol, direct  °4. Non-insulin dependent type 2 diabetes mellitus (HCC)  E11.9   °5. Benign hypertension  I10   °  ° °RECOMMENDATIONS:  °Arthur Sanders is a 60 y.o. male whose past medical history and cardiac risk factors include: Hypertension, hyperlipidemia, NIDDM Type II, GERD. ° °Coronary atherosclerosis due to calcified coronary lesion: °· Moderate coronary artery calcification with moderate disease in the LCx and LAD distribution per coronary CTA. °· The study was not suitable for CT FFR due to artifact present. °· Given his elevated 10-year risk of estimated ASCVD to be greater than 20%, atypical chest pain in the setting of diabetes, and moderate CAD per coronary CTA that shared decision was to proceed with invasive angiography to evaluate for obstructive disease. °· The procedure of left heart catheterization with possible intervention was explained to the patient in detail.  °· The indication, alternatives, risks and benefits were reviewed.  °· Complications include but not limited to bleeding, infection, vascular injury, stroke, myocardial infection, arrhythmia, kidney injury, radiation-related injury in the case of prolonged fluoroscopy use, emergency cardiac surgery, and  death. The patient understands the risks of serious complication is 1-2 in 1000 with diagnostic cardiac cath and 1-2% or less with angioplasty/stenting.  °· I have discussed in particular detail the possibility of acute renal failure after coronary angiography particularly if PCI is necessary.  I have described that it is possible patient may need short-term dialysis and the less likely may also require long-term dialysis depending on renal recovery.  I have asked for consent to proceed with coronary angiography understanding the risks of potentially needing dialysis, as well as the risks as stated above.   °· The patient and his wife voices understanding and provides verbal feedback and wishes to proceed with coronary angiography with possible PCI. ° °Precordial pain:  °· Plan as   noted above.  °· Transition to Toprol XL 50mg po qday  °· Sublingual nitroglycerin tablets to use on a as needed basis.  Medication profile discussed.   °· He is asked to seek medical attention by going to the closest ER via EMS if his symptoms increase in intensity, frequency, duration and or has typical discomfort as outlined during today's encounter.  Both patient and wife verbalized understanding ° °Non-insulin-dependent diabetes mellitus type 2: °· Hemoglobin A1c currently not at goal. °· Educated on importance of glycemic control given his other cardiovascular risk factors. °· Currently managed by primary care provider. ° °Hypertension: °· Office blood pressure is currently at goal.   °· Given the fact that he is diabetic would recommend a goal systolic blood pressure around 130 mmHg. °· Would recommend considering the addition of ARB given his non-insulin-dependent diabetes. °· Educated on importance of a low-salt diet. °· Currently managed by primary care provider. ° °Hyperlipidemia, mixed: °· Increase rosuvastatin to 40 mg p.o. nightly. °· LDL currently not at goal.  Would recommend an LDL goal of less than 70 mg/dL in the setting  of diabetes. °· Repeat fast lipid profile along with pre-cath labs.  ° °FINAL MEDICATION LIST END OF ENCOUNTER: °Meds ordered this encounter  °Medications  °• rosuvastatin (CRESTOR) 40 MG tablet  °  Sig: Take 1 tablet (40 mg total) by mouth at bedtime.  °  Dispense:  90 tablet  °  Refill:  0  °• metoprolol succinate (TOPROL XL) 50 MG 24 hr tablet  °  Sig: Take 1 tablet (50 mg total) by mouth in the morning.  °  Dispense:  90 tablet  °  Refill:  0  °• nitroGLYCERIN (NITROSTAT) 0.4 MG SL tablet  °  Sig: Place 1 tablet (0.4 mg total) under the tongue every 5 (five) minutes as needed for chest pain. If you require more than two tablets five minutes apart go to the nearest ER via EMS.  °  Dispense:  30 tablet  °  Refill:  0  °  ° °Current Outpatient Medications:  °•  aspirin 81 MG chewable tablet, Chew 81 mg by mouth daily., Disp: , Rfl:  °•  metFORMIN (GLUCOPHAGE) 500 MG tablet, Take 1,000 mg by mouth., Disp: , Rfl:  °•  metoprolol succinate (TOPROL XL) 50 MG 24 hr tablet, Take 1 tablet (50 mg total) by mouth in the morning., Disp: 90 tablet, Rfl: 0 °•  nitroGLYCERIN (NITROSTAT) 0.4 MG SL tablet, Place 1 tablet (0.4 mg total) under the tongue every 5 (five) minutes as needed for chest pain. If you require more than two tablets five minutes apart go to the nearest ER via EMS., Disp: 30 tablet, Rfl: 0 °•  omeprazole (PRILOSEC) 40 MG capsule, Take 40 mg by mouth daily., Disp: , Rfl:  °•  triamterene-hydrochlorothiazide (MAXZIDE-25) 37.5-25 MG tablet, Take 1 tablet by mouth daily., Disp: , Rfl:  °•  rosuvastatin (CRESTOR) 40 MG tablet, Take 1 tablet (40 mg total) by mouth at bedtime., Disp: 90 tablet, Rfl: 0 ° °Orders Placed This Encounter  °Procedures  °• SARS-COV-2 RNA,(COVID-19) QUAL NAAT  °• Lipid Panel With LDL/HDL Ratio  °• LDL cholesterol, direct  °• Basic metabolic panel  °• Magnesium  ° °There are no Patient Instructions on file for this visit.  ° °--Continue cardiac medications as reconciled in final medication  list. °--Return in about 4 weeks (around 02/25/2020) for Post heart catheterization. Or sooner if needed. °--Continue follow-up with your primary care   physician regarding the management of your other chronic comorbid conditions. ° °Patient's questions and concerns were addressed to his satisfaction. He voices understanding of the instructions provided during this encounter.  ° °This note was created using a voice recognition software as a result there may be grammatical errors inadvertently enclosed that do not reflect the nature of this encounter. Every attempt is made to correct such errors. ° °As part of this consultation reviewed outside records and outside labs provided by PCP, discussed disease management, ordered diagnostic testing, discussed management of risk factors, and coordination of care. ° °Uday Jantz, DO, FACC ° °Pager: [phone] °Office: [phone] ° ° °

## 2020-02-03 DIAGNOSIS — R072 Precordial pain: Secondary | ICD-10-CM | POA: Insufficient documentation

## 2020-02-10 NOTE — Telephone Encounter (Signed)
Durable Power of Attorney?

## 2020-02-12 ENCOUNTER — Other Ambulatory Visit (HOSPITAL_COMMUNITY)
Admission: RE | Admit: 2020-02-12 | Discharge: 2020-02-12 | Disposition: A | Discharge status: Home / Self Care | Payer: 59 | Source: Ambulatory Visit | Attending: Cardiology | Admitting: Cardiology

## 2020-02-12 DIAGNOSIS — Z20822 Contact with and (suspected) exposure to covid-19: Secondary | ICD-10-CM | POA: Diagnosis not present

## 2020-02-12 DIAGNOSIS — Z01812 Encounter for preprocedural laboratory examination: Secondary | ICD-10-CM | POA: Diagnosis present

## 2020-02-12 LAB — SARS CORONAVIRUS 2 (TAT 6-24 HRS): SARS Coronavirus 2: NEGATIVE

## 2020-02-14 ENCOUNTER — Other Ambulatory Visit: Payer: Self-pay

## 2020-02-14 DIAGNOSIS — I2510 Atherosclerotic heart disease of native coronary artery without angina pectoris: Secondary | ICD-10-CM

## 2020-02-15 DIAGNOSIS — I209 Angina pectoris, unspecified: Secondary | ICD-10-CM | POA: Diagnosis present

## 2020-02-15 DIAGNOSIS — I2510 Atherosclerotic heart disease of native coronary artery without angina pectoris: Secondary | ICD-10-CM | POA: Diagnosis present

## 2020-02-15 MED ORDER — METOPROLOL SUCCINATE ER 50 MG PO TB24: 50.0000 mg | ORAL_TABLET | Freq: Every morning | ORAL | 0 refills | Status: DC

## 2020-02-16 ENCOUNTER — Other Ambulatory Visit: Payer: Self-pay

## 2020-02-16 ENCOUNTER — Other Ambulatory Visit (HOSPITAL_COMMUNITY): Payer: Self-pay

## 2020-02-16 ENCOUNTER — Encounter (HOSPITAL_COMMUNITY)
Admission: RE | Disposition: A | Discharge status: Home / Self Care | Payer: Self-pay | Source: Home / Self Care | Attending: Cardiology

## 2020-02-16 ENCOUNTER — Ambulatory Visit (HOSPITAL_COMMUNITY)
Admission: RE | Admit: 2020-02-16 | Discharge: 2020-02-16 | Disposition: A | Discharge status: Home / Self Care | Payer: 59 | Attending: Cardiology | Admitting: Cardiology

## 2020-02-16 ENCOUNTER — Encounter (HOSPITAL_COMMUNITY): Payer: Self-pay | Admitting: Cardiology

## 2020-02-16 DIAGNOSIS — I2584 Coronary atherosclerosis due to calcified coronary lesion: Secondary | ICD-10-CM | POA: Insufficient documentation

## 2020-02-16 DIAGNOSIS — R072 Precordial pain: Secondary | ICD-10-CM | POA: Insufficient documentation

## 2020-02-16 DIAGNOSIS — Z7984 Long term (current) use of oral hypoglycemic drugs: Secondary | ICD-10-CM | POA: Insufficient documentation

## 2020-02-16 DIAGNOSIS — I209 Angina pectoris, unspecified: Secondary | ICD-10-CM | POA: Diagnosis present

## 2020-02-16 DIAGNOSIS — I2510 Atherosclerotic heart disease of native coronary artery without angina pectoris: Secondary | ICD-10-CM | POA: Diagnosis not present

## 2020-02-16 DIAGNOSIS — Z7982 Long term (current) use of aspirin: Secondary | ICD-10-CM | POA: Diagnosis not present

## 2020-02-16 DIAGNOSIS — E782 Mixed hyperlipidemia: Secondary | ICD-10-CM | POA: Insufficient documentation

## 2020-02-16 DIAGNOSIS — Z79899 Other long term (current) drug therapy: Secondary | ICD-10-CM | POA: Diagnosis not present

## 2020-02-16 DIAGNOSIS — I10 Essential (primary) hypertension: Secondary | ICD-10-CM | POA: Insufficient documentation

## 2020-02-16 DIAGNOSIS — E119 Type 2 diabetes mellitus without complications: Secondary | ICD-10-CM | POA: Diagnosis not present

## 2020-02-16 HISTORY — PX: INTRAVASCULAR PRESSURE WIRE/FFR STUDY: CATH118243

## 2020-02-16 HISTORY — PX: LEFT HEART CATH AND CORONARY ANGIOGRAPHY: CATH118249

## 2020-02-16 LAB — GLUCOSE, CAPILLARY: Glucose-Capillary: 163 mg/dL — ABNORMAL HIGH (ref 70–99)

## 2020-02-16 LAB — POCT I-STAT, CHEM 8
BUN: 17 mg/dL (ref 6–20)
Calcium, Ion: 1.34 mmol/L (ref 1.15–1.40)
Chloride: 93 mmol/L — ABNORMAL LOW (ref 98–111)
Creatinine, Ser: 1.5 mg/dL — ABNORMAL HIGH (ref 0.61–1.24)
Glucose, Bld: 178 mg/dL — ABNORMAL HIGH (ref 70–99)
HCT: 43 % (ref 39.0–52.0)
Hemoglobin: 14.6 g/dL (ref 13.0–17.0)
Potassium: 3.7 mmol/L (ref 3.5–5.1)
Sodium: 135 mmol/L (ref 135–145)
TCO2: 29 mmol/L (ref 22–32)

## 2020-02-16 LAB — CBC
HCT: 43.1 % (ref 39.0–52.0)
Hemoglobin: 13.8 g/dL (ref 13.0–17.0)
MCH: 27.4 pg (ref 26.0–34.0)
MCHC: 32 g/dL (ref 30.0–36.0)
MCV: 85.7 fL (ref 80.0–100.0)
Platelets: 217 10*3/uL (ref 150–400)
RBC: 5.03 MIL/uL (ref 4.22–5.81)
RDW: 11.7 % (ref 11.5–15.5)
WBC: 5.1 10*3/uL (ref 4.0–10.5)
nRBC: 0 % (ref 0.0–0.2)

## 2020-02-16 LAB — BASIC METABOLIC PANEL
Anion gap: 13 (ref 5–15)
BUN: 14 mg/dL (ref 6–20)
CO2: 27 mmol/L (ref 22–32)
Calcium: 10 mg/dL (ref 8.9–10.3)
Chloride: 95 mmol/L — ABNORMAL LOW (ref 98–111)
Creatinine, Ser: 1.56 mg/dL — ABNORMAL HIGH (ref 0.61–1.24)
GFR, Estimated: 51 mL/min — ABNORMAL LOW (ref >60)
Glucose, Bld: 186 mg/dL — ABNORMAL HIGH (ref 70–99)
Potassium: 3.5 mmol/L (ref 3.5–5.1)
Sodium: 135 mmol/L (ref 135–145)

## 2020-02-16 LAB — POCT ACTIVATED CLOTTING TIME: Activated Clotting Time: 255 seconds

## 2020-02-16 SURGERY — LEFT HEART CATH AND CORONARY ANGIOGRAPHY
Anesthesia: LOCAL

## 2020-02-16 MED ORDER — MIDAZOLAM HCL 2 MG/2ML IJ SOLN
INTRAMUSCULAR | Status: DC | PRN
  Administered 2020-02-16: 2 mg via INTRAVENOUS

## 2020-02-16 MED ORDER — IOHEXOL 350 MG/ML SOLN
INTRAVENOUS | Status: DC | PRN
  Administered 2020-02-16: 70 mL

## 2020-02-16 MED ORDER — SODIUM CHLORIDE 0.9% FLUSH: 3.0000 mL | Freq: Two times a day (BID) | INTRAVENOUS | Status: DC

## 2020-02-16 MED ORDER — ONDANSETRON HCL 4 MG/2ML IJ SOLN: 4.0000 mg | Freq: Four times a day (QID) | INTRAMUSCULAR | Status: DC | PRN

## 2020-02-16 MED ORDER — HEPARIN (PORCINE) IN NACL 1000-0.9 UT/500ML-% IV SOLN
INTRAVENOUS | Status: AC
  Filled 2020-02-16: qty 1000

## 2020-02-16 MED ORDER — SODIUM CHLORIDE 0.9 % WEIGHT BASED INFUSION
3.0000 mL/kg/h | INTRAVENOUS | Status: DC
  Administered 2020-02-16: 3 mL/kg/h via INTRAVENOUS

## 2020-02-16 MED ORDER — SODIUM CHLORIDE 0.9 % IV SOLN: 250.0000 mL | INTRAVENOUS | Status: DC | PRN

## 2020-02-16 MED ORDER — HEPARIN SODIUM (PORCINE) 1000 UNIT/ML IJ SOLN
INTRAMUSCULAR | Status: DC | PRN
  Administered 2020-02-16: 5000 [IU] via INTRAVENOUS
  Administered 2020-02-16: 4000 [IU] via INTRAVENOUS

## 2020-02-16 MED ORDER — HEPARIN (PORCINE) IN NACL 1000-0.9 UT/500ML-% IV SOLN
INTRAVENOUS | Status: DC | PRN
  Administered 2020-02-16 (×2): 500 mL

## 2020-02-16 MED ORDER — VERAPAMIL HCL 2.5 MG/ML IV SOLN
INTRAVENOUS | Status: DC | PRN
  Administered 2020-02-16: 4 mL via INTRA_ARTERIAL

## 2020-02-16 MED ORDER — HEPARIN SODIUM (PORCINE) 1000 UNIT/ML IJ SOLN
INTRAMUSCULAR | Status: AC
  Filled 2020-02-16: qty 1

## 2020-02-16 MED ORDER — FENTANYL CITRATE (PF) 100 MCG/2ML IJ SOLN
INTRAMUSCULAR | Status: DC | PRN
  Administered 2020-02-16: 25 ug via INTRAVENOUS

## 2020-02-16 MED ORDER — ACETAMINOPHEN 325 MG PO TABS: 650.0000 mg | ORAL_TABLET | ORAL | Status: DC | PRN

## 2020-02-16 MED ORDER — SODIUM CHLORIDE 0.9 % WEIGHT BASED INFUSION: 1.0000 mL/kg/h | INTRAVENOUS | Status: DC

## 2020-02-16 MED ORDER — ASPIRIN 81 MG PO CHEW: 81.0000 mg | CHEWABLE_TABLET | ORAL | Status: DC

## 2020-02-16 MED ORDER — NITROGLYCERIN 1 MG/10 ML FOR IR/CATH LAB
INTRA_ARTERIAL | Status: AC
  Filled 2020-02-16: qty 10

## 2020-02-16 MED ORDER — LIDOCAINE HCL (PF) 1 % IJ SOLN
INTRAMUSCULAR | Status: DC | PRN
  Administered 2020-02-16: 2 mL

## 2020-02-16 MED ORDER — LIDOCAINE HCL (PF) 1 % IJ SOLN
INTRAMUSCULAR | Status: AC
  Filled 2020-02-16: qty 30

## 2020-02-16 MED ORDER — VERAPAMIL HCL 2.5 MG/ML IV SOLN
INTRAVENOUS | Status: AC
  Filled 2020-02-16: qty 2

## 2020-02-16 MED ORDER — FENTANYL CITRATE (PF) 100 MCG/2ML IJ SOLN
INTRAMUSCULAR | Status: AC
  Filled 2020-02-16: qty 2

## 2020-02-16 MED ORDER — SODIUM CHLORIDE 0.9% FLUSH: 3.0000 mL | INTRAVENOUS | Status: DC | PRN

## 2020-02-16 MED ORDER — SODIUM CHLORIDE 0.9 % IV SOLN
INTRAVENOUS | Status: AC | PRN
  Administered 2020-02-16: 250 mL via INTRAVENOUS

## 2020-02-16 MED ORDER — MIDAZOLAM HCL 2 MG/2ML IJ SOLN
INTRAMUSCULAR | Status: AC
  Filled 2020-02-16: qty 2

## 2020-02-16 MED ORDER — NITROGLYCERIN 1 MG/10 ML FOR IR/CATH LAB
INTRA_ARTERIAL | Status: DC | PRN
  Administered 2020-02-16: 200 ug via INTRACORONARY

## 2020-02-16 SURGICAL SUPPLY — 12 items
CATH LAUNCHER 6FR AL.75 (CATHETERS) ×1 IMPLANT
CATH OPTITORQUE TIG 4.0 5F (CATHETERS) ×1 IMPLANT
DEVICE RAD COMP TR BAND LRG (VASCULAR PRODUCTS) ×1 IMPLANT
GLIDESHEATH SLEND A-KIT 6F 22G (SHEATH) ×1 IMPLANT
GUIDEWIRE INQWIRE 1.5J.035X260 (WIRE) IMPLANT
GUIDEWIRE PRESSURE COMET II (WIRE) ×1 IMPLANT
INQWIRE 1.5J .035X260CM (WIRE) ×2
KIT ESSENTIALS PG (KITS) ×1 IMPLANT
KIT HEART LEFT (KITS) ×2 IMPLANT
PACK CARDIAC CATHETERIZATION (CUSTOM PROCEDURE TRAY) ×2 IMPLANT
TRANSDUCER W/STOPCOCK (MISCELLANEOUS) ×2 IMPLANT
TUBING CIL FLEX 10 FLL-RA (TUBING) ×2 IMPLANT

## 2020-02-16 NOTE — Interval H&P Note (Signed)
History and Physical Interval Note:  02/16/2020 10:20 AM  Arthur Sanders  has presented today for surgery, with the diagnosis of CAD, shortness of breath.  The various methods of treatment have been discussed with the patient and family. After consideration of risks, benefits and other options for treatment, the patient has consented to  Procedure(s): LEFT HEART CATH AND CORONARY ANGIOGRAPHY (N/A) and [possible angioplasty as a surgical intervention.  The patient's history has been reviewed, patient examined, no change in status, stable for surgery.  I have reviewed the patient's chart and labs.  Questions were answered to the patient's satisfaction.    Cath Lab Visit (complete for each Cath Lab visit)  Clinical Evaluation Leading to the Procedure:   ACS: No.  Non-ACS:    Anginal Classification: CCS III  Anti-ischemic medical therapy: Minimal Therapy (1 class of medications)  Non-Invasive Test Results: Equivocal test results  Prior CABG: No previous CABG  Arthur Sanders

## 2020-02-16 NOTE — Discharge Instructions (Signed)
Drink plenty of fluid for 48 hours and keep wrist elevated at heart level for 24 hours  Radial Site Care   This sheet gives you information about how to care for yourself after your procedure. Your health care provider may also give you more specific instructions. If you have problems or questions, contact your health care provider. What can I expect after the procedure? After the procedure, it is common to have:  Bruising and tenderness at the catheter insertion area. Follow these instructions at home: Medicines  Take over-the-counter and prescription medicines only as told by your health care provider. Insertion site care 1. Follow instructions from your health care provider about how to take care of your insertion site. Make sure you: ? Wash your hands with soap and water before you change your bandage (dressing). If soap and water are not available, use hand sanitizer. ? remove your dressing as told by your health care provider. In 24 hours 2. Check your insertion site every day for signs of infection. Check for: ? Redness, swelling, or pain. ? Fluid or blood. ? Pus or a bad smell. ? Warmth. 3. Do not take baths, swim, or use a hot tub until your health care provider approves. 4. You may shower 24-48 hours after the procedure, or as directed by your health care provider. ? Remove the dressing and gently wash the site with plain soap and water. ? Pat the area dry with a clean towel. ? Do not rub the site. That could cause bleeding. 5. Do not apply powder or lotion to the site. Activity   1. For 24 hours after the procedure, or as directed by your health care provider: ? Do not flex or bend the affected arm. ? Do not push or pull heavy objects with the affected arm. ? Do not drive yourself home from the hospital or clinic. You may drive 24 hours after the procedure unless your health care provider tells you not to. ? Do not operate machinery or power tools. 2. Do not lift  anything that is heavier than 10 lb (4.5 kg), or the limit that you are told, until your health care provider says that it is safe. For 4 days 3. Ask your health care provider when it is okay to: ? Return to work or school. ? Resume usual physical activities or sports. ? Resume sexual activity. General instructions  If the catheter site starts to bleed, raise your arm and put firm pressure on the site. If the bleeding does not stop, get help right away. This is a medical emergency.  If you went home on the same day as your procedure, a responsible adult should be with you for the first 24 hours after you arrive home.  Keep all follow-up visits as told by your health care provider. This is important. Contact a health care provider if:  You have a fever.  You have redness, swelling, or yellow drainage around your insertion site. Get help right away if:  You have unusual pain at the radial site.  The catheter insertion area swells very fast.  The insertion area is bleeding, and the bleeding does not stop when you hold steady pressure on the area.  Your arm or hand becomes pale, cool, tingly, or numb. These symptoms may represent a serious problem that is an emergency. Do not wait to see if the symptoms will go away. Get medical help right away. Call your local emergency services (911 in the [place] Do not   drive yourself to the hospital. Summary  After the procedure, it is common to have bruising and tenderness at the site.  Follow instructions from your health care provider about how to take care of your radial site wound. Check the wound every day for signs of infection.  Do not lift anything that is heavier than 10 lb (4.5 kg), or the limit that you are told, until your health care provider says that it is safe. This information is not intended to replace advice given to you by your health care provider. Make sure you discuss any questions you have with your health care  provider. Document Revised: 02/06/2017 Document Reviewed: 02/06/2017 [employer] Patient Education  2020 [employer]  

## 2020-02-16 NOTE — Progress Notes (Signed)
Patient was given discharge instructions. He verbalized understanding. 

## 2020-02-18 ENCOUNTER — Ambulatory Visit: Payer: 59 | Admitting: Cardiology

## 2020-03-07 ENCOUNTER — Other Ambulatory Visit: Payer: Self-pay

## 2020-03-07 ENCOUNTER — Ambulatory Visit: Payer: 59 | Admitting: Cardiology

## 2020-03-07 ENCOUNTER — Encounter: Payer: Self-pay | Admitting: Cardiology

## 2020-03-07 VITALS — BP 118/82 | HR 74 | Temp 98.1°F | Resp 16 | Ht 73.0 in | Wt 190.0 lb

## 2020-03-07 DIAGNOSIS — I2510 Atherosclerotic heart disease of native coronary artery without angina pectoris: Secondary | ICD-10-CM

## 2020-03-07 DIAGNOSIS — E782 Mixed hyperlipidemia: Secondary | ICD-10-CM

## 2020-03-07 DIAGNOSIS — E119 Type 2 diabetes mellitus without complications: Secondary | ICD-10-CM

## 2020-03-07 DIAGNOSIS — I10 Essential (primary) hypertension: Secondary | ICD-10-CM

## 2020-03-07 NOTE — Progress Notes (Signed)
Date:  03/07/2020   ID:  Arthur Sanders, DOB 03-29-1960, MRN [id number]  PCP:  Rogers Blocker, MD  Cardiologist:  Rex Kras, DO, [facility] (established care 01/20/2020)  Date: 03/07/20 Last Office Visit: 01/28/2020  Chief Complaint  Patient presents with  . Post cath   . Follow-up    HPI  Arthur Sanders is a 60 y.o. male who presents to the office with a chief complaint of " follow-up post cath." Patient's past medical history and cardiovascular risk factors include: Moderate coronary artery disease with calcified coronary atherosclerosis, Hypertension, hyperlipidemia, NIDDM Type II, GERD.  He is referred to the office at the request of Rogers Blocker, MD for evaluation of chest pain.  Patient underwent a diagnostic evaluation of his symptoms of chest pain.  His coronary CTA showed calcified plaque in the LAD and LCx distribution.  And despite up titration of guideline directed medical therapy he continued to have atypical symptoms.  Given the coronary CTA findings, additional cardiovascular risk factors, and symptoms the plan was to proceed with left heart catheterization.  Patient did undergo left heart catheterization since last visit.  Results reviewed with him at today's office visit and noted below for further reference.  Patient did not undergo any coronary interventions.  Since left heart catheterization patient is doing well from a cardiovascular standpoint.  He has achieved successful hemostasis at the arteriotomy site.  He no longer is having anginal chest pain.  Patient states that his home blood pressure is also very well controlled around 125/83 mmHg.  No family history of premature coronary disease or sudden cardiac death.  FUNCTIONAL STATUS: Exercises three days a week (sit up, push up, planking).    ALLERGIES: Allergies  Allergen Reactions  . Egg [Eggs Or Egg-Derived Products]     MEDICATION LIST PRIOR TO VISIT: Current Meds  Medication Sig  . amLODipine  (NORVASC) 5 MG tablet Take 5 mg by mouth daily.  Marland Kitchen aspirin 81 MG chewable tablet Chew 81 mg by mouth daily.  . metFORMIN (GLUCOPHAGE) 500 MG tablet Take 1 tablet (500 mg total) by mouth in the morning and at bedtime.  . metoprolol succinate (TOPROL XL) 50 MG 24 hr tablet Take 1 tablet (50 mg total) by mouth in the morning. (Patient taking differently: Take 25 mg by mouth in the morning.)  . Multiple Vitamin (MULTIVITAMIN WITH MINERALS) TABS tablet Take 1 tablet by mouth daily.  . nitroGLYCERIN (NITROSTAT) 0.4 MG SL tablet Place 1 tablet (0.4 mg total) under the tongue every 5 (five) minutes as needed for chest pain. If you require more than two tablets five minutes apart go to the nearest ER via EMS. (Patient taking differently: Place 0.4 mg under the tongue every 5 (five) minutes x 3 doses as needed for chest pain. If you require more than two tablets five minutes apart go to the nearest ER via EMS.)  . Omega-3 Fatty Acids (FISH OIL PO) Take 1,600 mg by mouth daily.  Marland Kitchen omeprazole (PRILOSEC) 40 MG capsule Take 40 mg by mouth daily.  . rosuvastatin (CRESTOR) 40 MG tablet Take 1 tablet (40 mg total) by mouth at bedtime.  . triamterene-hydrochlorothiazide (MAXZIDE-25) 37.5-25 MG tablet Take 0.5 tablets by mouth daily.     PAST MEDICAL HISTORY: Past Medical History:  Diagnosis Date  . Allergy   . Coronary artery disease   . Diabetes mellitus without complication ([place])   . Hyperlipidemia   . Hypertension     PAST SURGICAL HISTORY:  Past Surgical History:  Procedure Laterality Date  . INTRAVASCULAR PRESSURE WIRE/FFR STUDY N/A 02/16/2020   Procedure: INTRAVASCULAR PRESSURE WIRE/FFR STUDY;  Surgeon: Adrian Prows, MD;  Location: [facility];  Service: Cardiovascular;  Laterality: N/A;  . LEFT HEART CATH AND CORONARY ANGIOGRAPHY N/A 02/16/2020   Procedure: LEFT HEART CATH AND CORONARY ANGIOGRAPHY;  Surgeon: Adrian Prows, MD;  Location: [facility];  Service: Cardiovascular;  Laterality: N/A;      FAMILY HISTORY: The patient family history includes Cancer (age of onset: 53) in his father; Diabetes in his father, mother, and sister; Hyperlipidemia in his sister; Hypertension in his father and mother; Stroke in his sister.  SOCIAL HISTORY:  The patient  reports that he has never smoked. He has never used smokeless tobacco. He reports current alcohol use. He reports that he does not use drugs.  REVIEW OF SYSTEMS: Review of Systems  Constitutional: Negative for chills and fever.  HENT: Negative for hoarse voice and nosebleeds.   Eyes: Negative for discharge, double vision and pain.  Cardiovascular: Negative for chest pain, claudication, dyspnea on exertion, leg swelling, near-syncope, orthopnea, palpitations, paroxysmal nocturnal dyspnea and syncope.  Respiratory: Negative for hemoptysis and shortness of breath.   Musculoskeletal: Negative for muscle cramps and myalgias.  Gastrointestinal: Negative for abdominal pain, constipation, diarrhea, hematemesis, hematochezia, melena, nausea and vomiting.  Neurological: Negative for dizziness and light-headedness.    PHYSICAL EXAM: Vitals with BMI 03/07/2020 02/16/2020 02/16/2020  Height $Remov'6\' 1"'pZvZFK$  - -  Weight 190 lbs - -  BMI 40.98 - -  Systolic 119 147 91  Diastolic 82 75 54  Pulse 74 58 87   CONSTITUTIONAL: Well-developed and well-nourished. No acute distress.  SKIN: Skin is warm and dry. No rash noted. No cyanosis. No pallor. No jaundice HEAD: Normocephalic and atraumatic.  EYES: No scleral icterus MOUTH/THROAT: Moist oral membranes.  NECK: No JVD present. No thyromegaly noted. No carotid bruits  LYMPHATIC: No visible cervical adenopathy.  CHEST Normal respiratory effort. No intercostal retractions  LUNGS: Clear to auscultation bilaterally.  No stridor. No wheezes. No rales.  CARDIOVASCULAR: Regular rate and rhythm, positive S1-S2, no murmurs rubs or gallops appreciated ABDOMINAL: No apparent ascites.  EXTREMITIES: No peripheral  edema, 2+ dorsalis pedis and posterior tibial pulses, warm to touch. HEMATOLOGIC: No significant bruising NEUROLOGIC: Oriented to person, place, and time. Nonfocal. Normal muscle tone.  PSYCHIATRIC: Normal mood and affect. Normal behavior. Cooperative  CARDIAC DATABASE: EKG: 01/20/2020: Normal sinus rhythm, 61 bpm, normal axis, T wave inversions in the inferior leads suggestive of possible ischemia, without underlying injury pattern.   Echocardiogram: 01/28/2020:  Normal LV systolic function with visual EF 60-65%.  Left ventricle cavity is normal in size. Normal global wall motion. Normal diastolic filling pattern, normal LAP.  Mild tricuspid regurgitation. No evidence of pulmonary hypertension.  No other significant valvular heart disease.  No prior study for comparison.   Stress Testing: No results found for this or any previous visit from the past 1095 days.  Coronary CTA: 01/26/2020  1. Coronary calcium score of 371. This was 96th percentile for age and sex matched control. 2. Normal coronary origin with right dominance. 3. CAD-RADS = 3. Moderate stenosis within proximal LAD, Moderate stenosis within proximal LCx. Mild stenosis within proximal RCA. 4. Study will not be sent for CT-FFR due to artifact from frequent PVCs and misregistration. Consider invasive coronary angiography, if clinically indicated. Non-cardiac portion: Negative over-read examination  Heart Catheterization: Left Heart Catheterization and DFR to LAD and  RCA 02/16/20:  LV: Normal LV systolic function, EDP 6 mmHg.  No pressure gradient across the aortic valve.  LVEF 55%. Left main: Large vessel.  No significant disease. LAD: Large vessel.  Proximal segment has a eccentric 50% stenosis.  There are several small to moderate-sized diagonals.  The stenosis is irregular.  No ulceration noted. Circumflex: Large caliber vessel.  Gives origin to 2 large OM branches.  There is mild disease in the midsegment constituting 20  to 30% stenosis. RCA: Dominant vessel.  Proximal segment has a small eccentric 60% stenosis.  DFR to proximal LAD = 0.92 and proximal RCA 0.95, not hemodynamically significant.  Recommendation: Aggressive risk factor modification is indicated.  Some of this symptoms of angina could be related to coronary spasm and could consider discontinuing diuretics and switching to amlodipine.  70 mL contrast utilized.  LABORATORY DATA: CBC Latest Ref Rng & Units 02/16/2020 02/16/2020 01/16/2020  WBC 4.0 - 10.5 K/uL - 5.1 6.9  Hemoglobin 13.0 - 17.0 g/dL 14.6 13.8 12.9(L)  Hematocrit 39.0 - 52.0 % 43.0 43.1 39.3  Platelets 150 - 400 K/uL - 217 242    CMP Latest Ref Rng & Units 02/16/2020 02/16/2020 01/16/2020  Glucose 70 - 99 mg/dL 178(H) 186(H) 179(H)  BUN 6 - 20 mg/dL $Remove'17 14 17  'zBHWodr$ Creatinine 0.61 - 1.24 mg/dL 1.50(H) 1.56(H) 1.40(H)  Sodium 135 - 145 mmol/L 135 135 133(L)  Potassium 3.5 - 5.1 mmol/L 3.7 3.5 3.7  Chloride 98 - 111 mmol/L 93(L) 95(L) 96(L)  CO2 22 - 32 mmol/L - 27 26  Calcium 8.9 - 10.3 mg/dL - 10.0 9.7  Total Protein 6.0 - 8.3 g/dL - - -  Total Bilirubin 0.3 - 1.2 mg/dL - - -  Alkaline Phos 39 - 117 U/L - - -  AST 0 - 37 U/L - - -  ALT 0 - 53 U/L - - -    No components found for: NTPROBNP No results for input(s): PROBNP in the last 8760 hours. No results for input(s): TSH in the last 8760 hours.  BMP Recent Labs    06/17/19 1727 01/16/20 0429 02/16/20 0932 02/16/20 0950  NA 133* 133* 135 135  K 4.0 3.7 3.5 3.7  CL 97* 96* 95* 93*  CO2 $Re'26 26 27  'HGm$ --   GLUCOSE 251* 179* 186* 178*  BUN $Re'13 17 14 17  'KPU$ CREATININE 1.12 1.40* 1.56* 1.50*  CALCIUM 9.4 9.7 10.0  --   GFRNONAA >60 59* 51*  --   GFRAA >60  --   --   --     HEMOGLOBIN A1C No results found for: HGBA1C, MPG  External Labs: Collected: 12/04/2019, outside labs received from PCP Creatinine 1.42 mg/dL. eGFR: 63 mL/min per 1.73 m Potassium 4.2 Lipid profile: Total cholesterol 153, triglycerides 97, HDL 41, LDL 92,  non-HDL 111 AST 20, ALT 18 Hemoglobin A1c: 8.5  IMPRESSION:    ICD-10-CM   1. Nonobstructive atherosclerosis of coronary artery  I25.10   2. Mixed hyperlipidemia  E78.2   3. Non-insulin dependent type 2 diabetes mellitus ([place])  E11.9   4. Benign hypertension  I10      RECOMMENDATIONS:  Arthur Sanders is a 60 y.o. male whose past medical history and cardiac risk factors include: Hypertension, hyperlipidemia, NIDDM Type II, GERD.  Nonobstructive coronary artery disease without angina pectoris:  Patient underwent coronary CTA and was noted to have moderate coronary artery calcification with disease burden in the LAD and the LCx distribution.  However, due  to artifact present on the study it was suboptimal for CT FFR evaluation.  Given the continued symptoms and multiple risk factors he underwent left heart catheterization since last visit.  Results reviewed and noted above for further reference.  Symptomatically patient has not had any anginal discomfort since last office visit.  Educated on the importance of improving his modifiable cardiovascular risk factors.   Non-insulin-dependent diabetes mellitus type 2:  Hemoglobin A1c currently not at goal.  Educated on importance of glycemic control given his other cardiovascular risk factors.  Currently managed by primary care provider.  Hypertension:  Office blood pressure is currently at goal.    Patient's home blood pressures are very well controlled.  However, he takes half of Maxide and half of Toprol-XL.  Recommend considering  ACE inhibitors or ARB given his underlying diabetes mellitus.  His blood pressure medications are currently managed by his PCP and recommended that he see his PCP for medication titration and possibly consolidating his antihypertensive medications to fewer pills so he does not have to take half tablets. Patient states that he will discuss it further with PCP.  Educated on importance of a low-salt  diet.  Currently managed by primary care provider.  Hyperlipidemia, mixed:  Continue rosuvastatin to 40 mg p.o. nightly.  LDL currently not at goal.  Would recommend an LDL goal of less than 70 mg/dL in the setting of diabetes.  Her cardiovascular standpoint patient is stable.  He is asked to follow-up in 1 year after his yearly physical with his PCP and to bring in his lab work at that time.  FINAL MEDICATION LIST END OF ENCOUNTER: No orders of the defined types were placed in this encounter.    Current Outpatient Medications:  .  amLODipine (NORVASC) 5 MG tablet, Take 5 mg by mouth daily., Disp: , Rfl:  .  aspirin 81 MG chewable tablet, Chew 81 mg by mouth daily., Disp: , Rfl:  .  metFORMIN (GLUCOPHAGE) 500 MG tablet, Take 1 tablet (500 mg total) by mouth in the morning and at bedtime., Disp: , Rfl:  .  metoprolol succinate (TOPROL XL) 50 MG 24 hr tablet, Take 1 tablet (50 mg total) by mouth in the morning. (Patient taking differently: Take 25 mg by mouth in the morning.), Disp: 90 tablet, Rfl: 0 .  Multiple Vitamin (MULTIVITAMIN WITH MINERALS) TABS tablet, Take 1 tablet by mouth daily., Disp: , Rfl:  .  nitroGLYCERIN (NITROSTAT) 0.4 MG SL tablet, Place 1 tablet (0.4 mg total) under the tongue every 5 (five) minutes as needed for chest pain. If you require more than two tablets five minutes apart go to the nearest ER via EMS. (Patient taking differently: Place 0.4 mg under the tongue every 5 (five) minutes x 3 doses as needed for chest pain. If you require more than two tablets five minutes apart go to the nearest ER via EMS.), Disp: 30 tablet, Rfl: 0 .  Omega-3 Fatty Acids (FISH OIL PO), Take 1,600 mg by mouth daily., Disp: , Rfl:  .  omeprazole (PRILOSEC) 40 MG capsule, Take 40 mg by mouth daily., Disp: , Rfl:  .  rosuvastatin (CRESTOR) 40 MG tablet, Take 1 tablet (40 mg total) by mouth at bedtime., Disp: 90 tablet, Rfl: 0 .  triamterene-hydrochlorothiazide (MAXZIDE-25) 37.5-25 MG  tablet, Take 0.5 tablets by mouth daily., Disp: , Rfl:   No orders of the defined types were placed in this encounter.  There are no Patient Instructions on file for this visit.   --  Continue cardiac medications as reconciled in final medication list. --Return in about 1 year (around 03/07/2021) for Follow up, CAD. Or sooner if needed. --Continue follow-up with your primary care physician regarding the management of your other chronic comorbid conditions.  Patient's questions and concerns were addressed to his satisfaction. He voices understanding of the instructions provided during this encounter.   This note was created using a voice recognition software as a result there may be grammatical errors inadvertently enclosed that do not reflect the nature of this encounter. Every attempt is made to correct such errors.  Rex Kras, [place], [facility]  Pager: [phone] Office: [phone]

## 2020-04-04 ENCOUNTER — Other Ambulatory Visit: Payer: Self-pay | Admitting: Cardiology

## 2020-04-04 DIAGNOSIS — I2510 Atherosclerotic heart disease of native coronary artery without angina pectoris: Secondary | ICD-10-CM

## 2020-04-04 DIAGNOSIS — R072 Precordial pain: Secondary | ICD-10-CM

## 2020-06-07 ENCOUNTER — Telehealth: Payer: Self-pay

## 2020-06-07 DIAGNOSIS — Z006 Encounter for examination for normal comparison and control in clinical research program: Secondary | ICD-10-CM

## 2020-06-07 NOTE — Telephone Encounter (Signed)
I have attempted without success to contact this patient by phone for his Identify 90 day follow up phone call. I left a message for patient to return my phone call with my name and callback number. An e-mail was also sent to patient.   

## 2020-08-22 ENCOUNTER — Other Ambulatory Visit: Payer: Self-pay | Admitting: Cardiology

## 2020-08-22 DIAGNOSIS — R072 Precordial pain: Secondary | ICD-10-CM

## 2020-08-22 DIAGNOSIS — I2510 Atherosclerotic heart disease of native coronary artery without angina pectoris: Secondary | ICD-10-CM

## 2020-08-22 DIAGNOSIS — I2584 Coronary atherosclerosis due to calcified coronary lesion: Secondary | ICD-10-CM

## 2020-09-29 ENCOUNTER — Telehealth: Payer: Self-pay

## 2020-09-29 NOTE — Telephone Encounter (Signed)
Error

## 2020-09-30 ENCOUNTER — Encounter: Payer: Self-pay | Admitting: Cardiology

## 2020-09-30 ENCOUNTER — Ambulatory Visit: Payer: 59 | Admitting: Cardiology

## 2020-09-30 ENCOUNTER — Inpatient Hospital Stay: Payer: 59

## 2020-09-30 ENCOUNTER — Other Ambulatory Visit: Payer: Self-pay

## 2020-09-30 VITALS — BP 136/82 | HR 76 | Temp 98.2°F | Resp 16 | Ht 73.0 in | Wt 190.0 lb

## 2020-09-30 DIAGNOSIS — I10 Essential (primary) hypertension: Secondary | ICD-10-CM

## 2020-09-30 DIAGNOSIS — R55 Syncope and collapse: Secondary | ICD-10-CM

## 2020-09-30 DIAGNOSIS — E119 Type 2 diabetes mellitus without complications: Secondary | ICD-10-CM

## 2020-09-30 DIAGNOSIS — E782 Mixed hyperlipidemia: Secondary | ICD-10-CM

## 2020-09-30 DIAGNOSIS — I2510 Atherosclerotic heart disease of native coronary artery without angina pectoris: Secondary | ICD-10-CM

## 2020-09-30 MED ORDER — METOPROLOL SUCCINATE ER 25 MG PO TB24: 25.0000 mg | ORAL_TABLET | Freq: Every day | ORAL | 0 refills | Status: DC

## 2020-09-30 NOTE — Progress Notes (Signed)
Date:  09/30/2020   ID:  Arthur Sanders, DOB 06-12-1960, MRN [id number]  PCP:  Arthur Bender, MD  Cardiologist:  Arthur Lerner, DO, [facility] (established care 01/20/2020)  Date: 09/30/20 Last Office Visit: 03/07/2020  Chief Complaint  Patient presents with   Follow-up   Loss of Consciousness    HPI  Arthur Sanders is a 60 y.o. male who presents to the office with a chief complaint of " loss of consciousness." Patient's past medical history and cardiovascular risk factors include: Moderate coronary artery disease with calcified coronary atherosclerosis, Hypertension, hyperlipidemia, NIDDM Type II, GERD.  He is referred to the office at the request of Arthur Bender, MD for evaluation of chest pain.  In the past patient did undergo an extensive ischemic evaluation including coronary CTA followed by left heart catheterization as he continued to have symptoms despite up titration of guideline directed medical therapy and antianginal therapy.  He now presents sooner than his scheduled visit and he recently had an episode of syncope.  Patient states that approximately 2 weeks ago he was at his regular state of health sitting at the couch and was walking to the main door to answer it.  While he was ambulating he was experiencing lightheadedness and a sensation of "going to blank out."  By the time he got to the door patient states that he lost consciousness and was on the floor.  The symptoms may have lasted for less than 30 seconds.  He got up off the floor and took it easy for the remaining of the day and did not seek medical attention by going to the closest ER via EMS.  To the best of his recollection he did not have any diaphoresis, vision changes, dizziness.  He did not lose control of his bowel or bladder function.  And no reoccurrence of symptoms over the last 2 weeks.  He now presents to the office for further evaluation and management.  FUNCTIONAL STATUS: Exercises three days a week  (sit up, push up, planking).    ALLERGIES: Allergies  Allergen Reactions   Egg [Eggs Or Egg-Derived Products]     MEDICATION LIST PRIOR TO VISIT: Current Meds  Medication Sig   amLODipine (NORVASC) 5 MG tablet Take 5 mg by mouth daily.   aspirin 81 MG chewable tablet Chew 81 mg by mouth daily.   metFORMIN (GLUCOPHAGE) 500 MG tablet Take 1 tablet (500 mg total) by mouth in the morning and at bedtime.   metoprolol succinate (TOPROL XL) 25 MG 24 hr tablet Take 1 tablet (25 mg total) by mouth daily.   Multiple Vitamin (MULTIVITAMIN WITH MINERALS) TABS tablet Take 1 tablet by mouth daily.   nitroGLYCERIN (NITROSTAT) 0.4 MG SL tablet PLACE 1 TABLET UNDER THE TONGUE EVERY 5 MINUTES AS NEEDED CHEST PAIN. IF YOU REQUIRE MORE THAN 2 TABLETS 5 MINUTES APART GO TO ER.   Omega-3 Fatty Acids (FISH OIL PO) Take 1,600 mg by mouth daily.   omeprazole (PRILOSEC) 40 MG capsule Take 40 mg by mouth daily.     PAST MEDICAL HISTORY: Past Medical History:  Diagnosis Date   Allergy    Coronary artery disease    Diabetes mellitus without complication (HCC)    Hyperlipidemia    Hypertension     PAST SURGICAL HISTORY: Past Surgical History:  Procedure Laterality Date   INTRAVASCULAR PRESSURE WIRE/FFR STUDY N/A 02/16/2020   Procedure: INTRAVASCULAR PRESSURE WIRE/FFR STUDY;  Surgeon: Arthur Decamp, MD;  Location: [facility] INVASIVE CV  LAB;  Service: Cardiovascular;  Laterality: N/A;   LEFT HEART CATH AND CORONARY ANGIOGRAPHY N/A 02/16/2020   Procedure: LEFT HEART CATH AND CORONARY ANGIOGRAPHY;  Surgeon: Arthur Prows, MD;  Location: [facility];  Service: Cardiovascular;  Laterality: N/A;     FAMILY HISTORY: The patient family history includes Cancer (age of onset: 69) in his father; Diabetes in his father, mother, and sister; Hyperlipidemia in his sister; Hypertension in his father and mother; Stroke in his sister.  SOCIAL HISTORY:  The patient  reports that he has never smoked. He has never used smokeless  tobacco. He reports current alcohol use. He reports that he does not use drugs.  REVIEW OF SYSTEMS: Review of Systems  Constitutional: Negative for chills and fever.  HENT:  Negative for hoarse voice and nosebleeds.   Eyes:  Negative for discharge, double vision and pain.  Cardiovascular:  Positive for syncope. Negative for chest pain, claudication, dyspnea on exertion, leg swelling, near-syncope, orthopnea, palpitations and paroxysmal nocturnal dyspnea.  Respiratory:  Negative for hemoptysis and shortness of breath.   Musculoskeletal:  Negative for muscle cramps and myalgias.  Gastrointestinal:  Negative for abdominal pain, constipation, diarrhea, hematemesis, hematochezia, melena, nausea and vomiting.  Neurological:  Negative for dizziness and light-headedness.   PHYSICAL EXAM: Vitals with BMI 09/30/2020 03/07/2020 02/16/2020  Height $Remov'6\' 1"'BOdizF$  $Remove'6\' 1"'uUIJEDQ$  -  Weight 190 lbs 190 lbs -  BMI 65.53 74.82 -  Systolic 707 867 544  Diastolic 82 82 75  Pulse 76 74 58    CONSTITUTIONAL: Well-developed and well-nourished. No acute distress.  SKIN: Skin is warm and dry. No rash noted. No cyanosis. No pallor. No jaundice HEAD: Normocephalic and atraumatic.  EYES: No scleral icterus MOUTH/THROAT: Moist oral membranes.  NECK: No JVD present. No thyromegaly noted. No carotid bruits  LYMPHATIC: No visible cervical adenopathy.  CHEST Normal respiratory effort. No intercostal retractions  LUNGS: Clear to auscultation bilaterally.  No stridor. No wheezes. No rales.  CARDIOVASCULAR: Regular rate and rhythm, positive S1-S2, no murmurs rubs or gallops appreciated ABDOMINAL: No apparent ascites.  EXTREMITIES: No peripheral edema, 2+ dorsalis pedis and posterior tibial pulses, warm to touch. HEMATOLOGIC: No significant bruising NEUROLOGIC: Oriented to person, place, and time. Nonfocal. Normal muscle tone.  PSYCHIATRIC: Normal mood and affect. Normal behavior. Cooperative  CARDIAC DATABASE: EKG: 01/20/2020: Normal  sinus rhythm, 61 bpm, normal axis, T wave inversions in the inferior leads suggestive of possible ischemia, without underlying injury pattern.   09/30/2020: Normal sinus rhythm, 69 bpm, T wave inversions in the inferolateral leads suggestive of possible inferolateral ischemia.  Without underlying injury pattern.  Echocardiogram: 01/28/2020:  Normal LV systolic function with visual EF 60-65%.  Left ventricle cavity is normal in size. Normal global wall motion. Normal diastolic filling pattern, normal LAP.  Mild tricuspid regurgitation. No evidence of pulmonary hypertension.  No other significant valvular heart disease.  No prior study for comparison.   Stress Testing: No results found for this or any previous visit from the past 1095 days.  Coronary CTA: 01/26/2020  1. Coronary calcium score of 371. This was 96th percentile for age and sex matched control. 2. Normal coronary origin with right dominance. 3. CAD-RADS = 3. Moderate stenosis within proximal LAD, Moderate stenosis within proximal LCx. Mild stenosis within proximal RCA. 4. Study will not be sent for CT-FFR due to artifact from frequent PVCs and misregistration. Consider invasive coronary angiography, if clinically indicated. Non-cardiac portion: Negative over-read examination  Heart Catheterization: Left Heart Catheterization and DFR  to LAD and RCA 02/16/20:  LV: Normal LV systolic function, EDP 6 mmHg.  No pressure gradient across the aortic valve.  LVEF 55%. Left main: Large vessel.  No significant disease. LAD: Large vessel.  Proximal segment has a eccentric 50% stenosis.  There are several small to moderate-sized diagonals.  The stenosis is irregular.  No ulceration noted. Circumflex: Large caliber vessel.  Gives origin to 2 large OM branches.  There is mild disease in the midsegment constituting 20 to 30% stenosis. RCA: Dominant vessel.  Proximal segment has a small eccentric 60% stenosis.   DFR to proximal LAD = 0.92 and  proximal RCA 0.95, not hemodynamically significant.   Recommendation: Aggressive risk factor modification is indicated.  Some of this symptoms of angina could be related to coronary spasm and could consider discontinuing diuretics and switching to amlodipine.  70 mL contrast utilized.  LABORATORY DATA: CBC Latest Ref Rng & Units 02/16/2020 02/16/2020 01/16/2020  WBC 4.0 - 10.5 K/uL - 5.1 6.9  Hemoglobin 13.0 - 17.0 g/dL 14.6 13.8 12.9(L)  Hematocrit 39.0 - 52.0 % 43.0 43.1 39.3  Platelets 150 - 400 K/uL - 217 242    CMP Latest Ref Rng & Units 02/16/2020 02/16/2020 01/16/2020  Glucose 70 - 99 mg/dL 178(H) 186(H) 179(H)  BUN 6 - 20 mg/dL $Remove'17 14 17  'acxZPwS$ Creatinine 0.61 - 1.24 mg/dL 1.50(H) 1.56(H) 1.40(H)  Sodium 135 - 145 mmol/L 135 135 133(L)  Potassium 3.5 - 5.1 mmol/L 3.7 3.5 3.7  Chloride 98 - 111 mmol/L 93(L) 95(L) 96(L)  CO2 22 - 32 mmol/L - 27 26  Calcium 8.9 - 10.3 mg/dL - 10.0 9.7  Total Protein 6.0 - 8.3 g/dL - - -  Total Bilirubin 0.3 - 1.2 mg/dL - - -  Alkaline Phos 39 - 117 U/L - - -  AST 0 - 37 U/L - - -  ALT 0 - 53 U/L - - -    No components found for: NTPROBNP No results for input(s): PROBNP in the last 8760 hours. No results for input(s): TSH in the last 8760 hours.  BMP Recent Labs    01/16/20 0429 02/16/20 0932 02/16/20 0950  NA 133* 135 135  K 3.7 3.5 3.7  CL 96* 95* 93*  CO2 26 27  --   GLUCOSE 179* 186* 178*  BUN $Re'17 14 17  'ZnL$ CREATININE 1.40* 1.56* 1.50*  CALCIUM 9.7 10.0  --   GFRNONAA 59* 51*  --     HEMOGLOBIN A1C No results found for: HGBA1C, MPG  External Labs: Collected: 12/04/2019, outside labs received from PCP Creatinine 1.42 mg/dL. eGFR: 63 mL/min per 1.73 m Potassium 4.2 Lipid profile: Total cholesterol 153, triglycerides 97, HDL 41, LDL 92, non-HDL 111 AST 20, ALT 18 Hemoglobin A1c: 8.5  IMPRESSION:    ICD-10-CM   1. Syncope and collapse  R55 EKG 12-Lead    LONG TERM MONITOR-LIVE TELEMETRY (3-14 DAYS)    2. Coronary atherosclerosis due  to calcified coronary lesion  I25.10    I25.84     3. Nonobstructive atherosclerosis of coronary artery  I25.10 metoprolol succinate (TOPROL XL) 25 MG 24 hr tablet    4. Non-insulin dependent type 2 diabetes mellitus ([place])  E11.9     5. Benign hypertension  I10     6. Mixed hyperlipidemia  E78.2        RECOMMENDATIONS:  CHEROKEE CLOWERS is a 60 y.o. male whose past medical history and cardiac risk factors include: Moderate coronary artery disease with  calcified coronary atherosclerosis, Hypertension, hyperlipidemia, NIDDM Type II, GERD.  Syncope: Echocardiogram: Noted to show preserved LVEF, no significant valvular heart disease. Coronary CTA/left heart catheterization.  Patient is noted to have coronary artery calcification, moderate CAD, and no significant hemodynamic stenosis as per invasive angiography/hemodynamics. EKG today shows normal sinus rhythm with T wave inversions in inferolateral leads.  The T wave changes in the lateral leads appear to be new compared to prior.  However, he does not have any anginal discomfort. Will add Toprol-XL 25 mg p.o. daily. 14-day mobile cardiac ambulatory telemetry. Patient is asked to seek medical attention if he has reoccurrence of syncope by going to the closest ER via EMS. I have asked him to discuss this further with PCP and possibly consider neurology evaluation as well. Patient is aware and in [place] rules and regulation, he is advised not to drive or operate machinery as he needs to remain asymptomatic for 6 months.  Patient verbalized understanding and provides verbal feedback.  Nonobstructive coronary artery disease without angina pectoris: CAC 371, 96th percentile Underwent left heart catheterization and was noted to have no hemodynamically significant stenosis as of 02/2020. Educated on the importance of secondary prevention. Start Toprol-XL 25 mg p.o. daily EKG independently reviewed as noted above. Continue to  monitor.  Non-insulin-dependent diabetes mellitus type 2: Hemoglobin A1c currently not at goal. Educated on importance of glycemic control given his other cardiovascular risk factors. Currently managed by primary care provider.  Hypertension: Office blood pressures are within acceptable range. Medications reconciled. Patient states that at times he takes other antihypertensive medications and sublingual nitroglycerin tablets for blood pressure management. Instead I have asked him to keep a log of his blood pressures and either review it with myself or PCP so that his antihypertensive medications can further be uptitrated. Monitor for now  Hyperlipidemia, mixed: Continue rosuvastatin to 40 mg p.o. nightly. LDL currently not at goal.  Would recommend an LDL goal of less than 70 mg/dL in the setting of diabetes.  FINAL MEDICATION LIST END OF ENCOUNTER: Meds ordered this encounter  Medications   metoprolol succinate (TOPROL XL) 25 MG 24 hr tablet    Sig: Take 1 tablet (25 mg total) by mouth daily.    Dispense:  30 tablet    Refill:  0     Current Outpatient Medications:    amLODipine (NORVASC) 5 MG tablet, Take 5 mg by mouth daily., Disp: , Rfl:    aspirin 81 MG chewable tablet, Chew 81 mg by mouth daily., Disp: , Rfl:    metFORMIN (GLUCOPHAGE) 500 MG tablet, Take 1 tablet (500 mg total) by mouth in the morning and at bedtime., Disp: , Rfl:    metoprolol succinate (TOPROL XL) 25 MG 24 hr tablet, Take 1 tablet (25 mg total) by mouth daily., Disp: 30 tablet, Rfl: 0   Multiple Vitamin (MULTIVITAMIN WITH MINERALS) TABS tablet, Take 1 tablet by mouth daily., Disp: , Rfl:    nitroGLYCERIN (NITROSTAT) 0.4 MG SL tablet, PLACE 1 TABLET UNDER THE TONGUE EVERY 5 MINUTES AS NEEDED CHEST PAIN. IF YOU REQUIRE MORE THAN 2 TABLETS 5 MINUTES APART GO TO ER., Disp: 25 tablet, Rfl: 0   Omega-3 Fatty Acids (FISH OIL PO), Take 1,600 mg by mouth daily., Disp: , Rfl:    omeprazole (PRILOSEC) 40 MG capsule,  Take 40 mg by mouth daily., Disp: , Rfl:    baclofen (LIORESAL) 10 MG tablet, Take 10 mg by mouth 2 (two) times daily., Disp: , Rfl:  rosuvastatin (CRESTOR) 40 MG tablet, Take 1 tablet (40 mg total) by mouth at bedtime. (Patient taking differently: Take 10 mg by mouth at bedtime.), Disp: 90 tablet, Rfl: 0   triamterene-hydrochlorothiazide (MAXZIDE-25) 37.5-25 MG tablet, Take 0.5 tablets by mouth daily., Disp: , Rfl:   Orders Placed This Encounter  Procedures   LONG TERM MONITOR-LIVE TELEMETRY (3-14 DAYS)   EKG 12-Lead    There are no Patient Instructions on file for this visit.   --Continue cardiac medications as reconciled in final medication list. --Return in about 4 weeks (around 10/28/2020) for Follow up syncope, review monitor results. Or sooner if needed. --Continue follow-up with your primary care physician regarding the management of your other chronic comorbid conditions.  Patient's questions and concerns were addressed to his satisfaction. He voices understanding of the instructions provided during this encounter.   This note was created using a voice recognition software as a result there may be grammatical errors inadvertently enclosed that do not reflect the nature of this encounter. Every attempt is made to correct such errors.  Rex Kras, [place], [facility]  Pager: [phone] Office: [phone]

## 2020-10-28 ENCOUNTER — Other Ambulatory Visit: Payer: Self-pay

## 2020-10-28 ENCOUNTER — Ambulatory Visit: Payer: 59 | Admitting: Cardiology

## 2020-10-28 ENCOUNTER — Ambulatory Visit: Payer: 59 | Admitting: Student

## 2020-10-28 ENCOUNTER — Encounter: Payer: Self-pay | Admitting: Student

## 2020-10-28 VITALS — BP 124/76 | HR 60 | Temp 98.2°F | Resp 16 | Ht 73.0 in | Wt 192.2 lb

## 2020-10-28 DIAGNOSIS — E119 Type 2 diabetes mellitus without complications: Secondary | ICD-10-CM

## 2020-10-28 DIAGNOSIS — E782 Mixed hyperlipidemia: Secondary | ICD-10-CM

## 2020-10-28 DIAGNOSIS — I2510 Atherosclerotic heart disease of native coronary artery without angina pectoris: Secondary | ICD-10-CM

## 2020-10-28 DIAGNOSIS — R55 Syncope and collapse: Secondary | ICD-10-CM

## 2020-10-28 DIAGNOSIS — I10 Essential (primary) hypertension: Secondary | ICD-10-CM

## 2020-10-28 MED ORDER — ROSUVASTATIN CALCIUM 20 MG PO TABS: 20.0000 mg | ORAL_TABLET | Freq: Every day | ORAL | 3 refills | Status: DC

## 2020-10-28 MED ORDER — ROSUVASTATIN CALCIUM 10 MG PO TABS: 10.0000 mg | ORAL_TABLET | Freq: Every day | ORAL | 0 refills | Status: DC

## 2020-10-28 NOTE — Progress Notes (Signed)
Date:  10/28/2020   ID:  Arthur Sanders, DOB 1960-10-04, MRN [id number]  PCP:  Rogers Blocker, MD  Cardiologist:  Alethia Berthold, DO, [facility] (established care 01/20/2020)  Date: 10/28/20 Last Office Visit: 03/07/2020  Chief Complaint  Patient presents with   Syncope and collapse   Results    Zio monitor    Follow-up    HPI  Arthur Sanders is a 60 y.o. male who presents to the office with a chief complaint of " loss of consciousness." Patient's past medical history and cardiovascular risk factors include: Moderate coronary artery disease with calcified coronary atherosclerosis, Hypertension, hyperlipidemia, NIDDM Type II, GERD.  He is referred to the office at the request of Rogers Blocker, MD for evaluation of chest pain.  In the past patient did undergo an extensive ischemic evaluation including coronary CTA followed by left heart catheterization as he continued to have symptoms despite up titration of guideline directed medical therapy and antianginal therapy.  Patient presents for 4-week follow-up of syncope.  Given episode of syncope patient underwent 2-week cardiac monitor which revealed asymptomatic episodes of SVT as well as 5 feet NSVT.  Patient has had no recurrence of syncope or near syncope.  At last office visit patient was started on metoprolol 25 mg daily, however it is caused fatigue he has therefore reduced due to 12.5 mg p.o. daily and is feeling better.  He is otherwise asymptomatic.  Notably patient has only been taking rosuvastatin 5 mg p.o. daily and recent lipid profile testing by PCP noted LDL of 96.  FUNCTIONAL STATUS: Exercises three days a week (sit up, push up, planking).    ALLERGIES: Allergies  Allergen Reactions   Egg [Eggs Or Egg-Derived Products]     MEDICATION LIST PRIOR TO VISIT: Current Meds  Medication Sig   amLODipine (NORVASC) 5 MG tablet Take 5 mg by mouth daily.   aspirin 81 MG chewable tablet Chew 81 mg by mouth daily.    baclofen (LIORESAL) 10 MG tablet Take 10 mg by mouth 2 (two) times daily.   Coenzyme Q10 (COQ-10 PO) Take 1 capsule by mouth daily at 12 noon.   metFORMIN (GLUCOPHAGE) 500 MG tablet Take 1 tablet (500 mg total) by mouth in the morning and at bedtime.   metoprolol succinate (TOPROL XL) 25 MG 24 hr tablet Take 1 tablet (25 mg total) by mouth daily.   Multiple Vitamin (MULTIVITAMIN WITH MINERALS) TABS tablet Take 1 tablet by mouth daily.   nitroGLYCERIN (NITROSTAT) 0.4 MG SL tablet PLACE 1 TABLET UNDER THE TONGUE EVERY 5 MINUTES AS NEEDED CHEST PAIN. IF YOU REQUIRE MORE THAN 2 TABLETS 5 MINUTES APART GO TO ER.   Omega-3 Fatty Acids (FISH OIL PO) Take 1,600 mg by mouth daily.   omeprazole (PRILOSEC) 40 MG capsule Take 40 mg by mouth daily.   rosuvastatin (CRESTOR) 20 MG tablet Take 1 tablet (20 mg total) by mouth daily.   triamterene-hydrochlorothiazide (MAXZIDE-25) 37.5-25 MG tablet Take 0.5 tablets by mouth daily.   [DISCONTINUED] rosuvastatin (CRESTOR) 40 MG tablet Take 1 tablet (40 mg total) by mouth at bedtime. (Patient taking differently: Take 10 mg by mouth at bedtime.)     PAST MEDICAL HISTORY: Past Medical History:  Diagnosis Date   Allergy    Coronary artery disease    Diabetes mellitus without complication ([place])    Hyperlipidemia    Hypertension     PAST SURGICAL HISTORY: Past Surgical History:  Procedure Laterality Date   INTRAVASCULAR  PRESSURE WIRE/FFR STUDY N/A 02/16/2020   Procedure: INTRAVASCULAR PRESSURE WIRE/FFR STUDY;  Surgeon: Adrian Prows, MD;  Location: [place] [facility];  Service: Cardiovascular;  Laterality: N/A;   LEFT HEART CATH AND CORONARY ANGIOGRAPHY N/A 02/16/2020   Procedure: LEFT HEART CATH AND CORONARY ANGIOGRAPHY;  Surgeon: Adrian Prows, MD;  Location: [place] [facility];  Service: Cardiovascular;  Laterality: N/A;     FAMILY HISTORY: The patient family history includes Cancer (age of onset: 42) in his father; Diabetes in his father, mother, and sister;  Hyperlipidemia in his sister; Hypertension in his father and mother; Stroke in his sister.  SOCIAL HISTORY:  The patient  reports that he has never smoked. He has never used smokeless tobacco. He reports current alcohol use. He reports that he does not use drugs.  REVIEW OF SYSTEMS: Review of Systems  Constitutional: Negative for chills and fever.  HENT:  Negative for hoarse voice and nosebleeds.   Eyes:  Negative for discharge, double vision and pain.  Cardiovascular:  Negative for chest pain, claudication, dyspnea on exertion, leg swelling, near-syncope, orthopnea, palpitations, paroxysmal nocturnal dyspnea and syncope (no recurrence).  Respiratory:  Negative for hemoptysis and shortness of breath.   Musculoskeletal:  Negative for muscle cramps and myalgias.  Gastrointestinal:  Negative for abdominal pain, constipation, diarrhea, hematemesis, hematochezia, melena, nausea and vomiting.  Neurological:  Negative for dizziness and light-headedness.   PHYSICAL EXAM: Vitals with BMI 10/28/2020 09/30/2020 03/07/2020  Height $Remov'6\' 1"'vIzYim$  $Remove'6\' 1"'nPNiXDM$  $RemoveB'6\' 1"'vWhwFOEq$   Weight 192 lbs 3 oz 190 lbs 190 lbs  BMI 25.36 43.32 95.18  Systolic 841 660 630  Diastolic 76 82 82  Pulse 60 76 74    CONSTITUTIONAL: Well-developed and well-nourished. No acute distress.  SKIN: Skin is warm and dry. No rash noted. No cyanosis. No pallor. No jaundice HEAD: Normocephalic and atraumatic.  EYES: No scleral icterus MOUTH/THROAT: Moist oral membranes.  NECK: No JVD present. No thyromegaly noted. No carotid bruits  LYMPHATIC: No visible cervical adenopathy.  CHEST Normal respiratory effort. No intercostal retractions  LUNGS: Clear to auscultation bilaterally.  No stridor. No wheezes. No rales.  CARDIOVASCULAR: Regular rate and rhythm, positive S1-S2, no murmurs rubs or gallops appreciated ABDOMINAL: No apparent ascites.  EXTREMITIES: No peripheral edema, 2+ dorsalis pedis and posterior tibial pulses, warm to touch. HEMATOLOGIC: No  significant bruising NEUROLOGIC: Oriented to person, place, and time. Nonfocal. Normal muscle tone.  PSYCHIATRIC: Normal mood and affect. Normal behavior. Cooperative Physical exam unchanged compared to previous.  CARDIAC DATABASE: EKG: 01/20/2020: Normal sinus rhythm, 61 bpm, normal axis, T wave inversions in the inferior leads suggestive of possible ischemia, without underlying injury pattern.   09/30/2020: Normal sinus rhythm, 69 bpm, T wave inversions in the inferolateral leads suggestive of possible inferolateral ischemia.  Without underlying injury pattern.  Echocardiogram: 01/28/2020:  Normal LV systolic function with visual EF 60-65%.  Left ventricle cavity is normal in size. Normal global wall motion. Normal diastolic filling pattern, normal LAP.  Mild tricuspid regurgitation. No evidence of pulmonary hypertension.  No other significant valvular heart disease.  No prior study for comparison.   Stress Testing: No results found for this or any previous visit from the past 1095 days.  Coronary CTA: 01/26/2020  1. Coronary calcium score of 371. This was 96th percentile for age and sex matched control. 2. Normal coronary origin with right dominance. 3. CAD-RADS = 3. Moderate stenosis within proximal LAD, Moderate stenosis within proximal LCx. Mild stenosis within proximal RCA. 4. Study will not  be sent for CT-FFR due to artifact from frequent PVCs and misregistration. Consider invasive coronary angiography, if clinically indicated. Non-cardiac portion: Negative over-read examination  Heart Catheterization: Left Heart Catheterization and DFR to LAD and RCA 02/16/20:  LV: Normal LV systolic function, EDP 6 mmHg.  No pressure gradient across the aortic valve.  LVEF 55%. Left main: Large vessel.  No significant disease. LAD: Large vessel.  Proximal segment has a eccentric 50% stenosis.  There are several small to moderate-sized diagonals.  The stenosis is irregular.  No ulceration  noted. Circumflex: Large caliber vessel.  Gives origin to 2 large OM branches.  There is mild disease in the midsegment constituting 20 to 30% stenosis. RCA: Dominant vessel.  Proximal segment has a small eccentric 60% stenosis.   DFR to proximal LAD = 0.92 and proximal RCA 0.95, not hemodynamically significant.   Recommendation: Aggressive risk factor modification is indicated.  Some of this symptoms of angina could be related to coronary spasm and could consider discontinuing diuretics and switching to amlodipine.  70 mL contrast utilized.  LABORATORY DATA: CBC Latest Ref Rng & Units 02/16/2020 02/16/2020 01/16/2020  WBC 4.0 - 10.5 K/uL - 5.1 6.9  Hemoglobin 13.0 - 17.0 g/dL 14.6 13.8 12.9(L)  Hematocrit 39.0 - 52.0 % 43.0 43.1 39.3  Platelets 150 - 400 K/uL - 217 242    CMP Latest Ref Rng & Units 02/16/2020 02/16/2020 01/16/2020  Glucose 70 - 99 mg/dL 178(H) 186(H) 179(H)  BUN 6 - 20 mg/dL $Remove'17 14 17  'dEqGZag$ Creatinine 0.61 - 1.24 mg/dL 1.50(H) 1.56(H) 1.40(H)  Sodium 135 - 145 mmol/L 135 135 133(L)  Potassium 3.5 - 5.1 mmol/L 3.7 3.5 3.7  Chloride 98 - 111 mmol/L 93(L) 95(L) 96(L)  CO2 22 - 32 mmol/L - 27 26  Calcium 8.9 - 10.3 mg/dL - 10.0 9.7  Total Protein 6.0 - 8.3 g/dL - - -  Total Bilirubin 0.3 - 1.2 mg/dL - - -  Alkaline Phos 39 - 117 U/L - - -  AST 0 - 37 U/L - - -  ALT 0 - 53 U/L - - -    No components found for: NTPROBNP No results for input(s): PROBNP in the last 8760 hours. No results for input(s): TSH in the last 8760 hours.  BMP Recent Labs    01/16/20 0429 02/16/20 0932 02/16/20 0950  NA 133* 135 135  K 3.7 3.5 3.7  CL 96* 95* 93*  CO2 26 27  --   GLUCOSE 179* 186* 178*  BUN $Re'17 14 17  'JSx$ CREATININE 1.40* 1.56* 1.50*  CALCIUM 9.7 10.0  --   GFRNONAA 59* 51*  --     HEMOGLOBIN A1C No results found for: HGBA1C, MPG  External Labs: Collected: 12/04/2019, outside labs received from PCP Creatinine 1.42 mg/dL. eGFR: 63 mL/min per 1.73 m Potassium 4.2 Lipid profile:  Total cholesterol 153, triglycerides 97, HDL 41, LDL 92, non-HDL 111 AST 20, ALT 18 Hemoglobin A1c: 8.5  IMPRESSION:    ICD-10-CM   1. Syncope and collapse  R55     2. Coronary atherosclerosis due to calcified coronary lesion  I25.10 DISCONTINUED: rosuvastatin (CRESTOR) 10 MG tablet   I25.84     3. Non-insulin dependent type 2 diabetes mellitus ([place])  E11.9     4. Benign hypertension  I10     5. Mixed hyperlipidemia  E78.2 Lipid Panel With LDL/HDL Ratio    DISCONTINUED: rosuvastatin (CRESTOR) 10 MG tablet    6. Nonobstructive atherosclerosis of coronary artery  I25.10 Lipid Panel With LDL/HDL Ratio       RECOMMENDATIONS:  Arthur Sanders is a 60 y.o. male whose past medical history and cardiac risk factors include: Moderate coronary artery disease with calcified coronary atherosclerosis, Hypertension, hyperlipidemia, NIDDM Type II, GERD.  Syncope: No recurrence  Echocardiogram: Noted to show preserved LVEF, no significant valvular heart disease. Coronary CTA/left heart catheterization.  Patient is noted to have coronary artery calcification, moderate CAD, and no significant hemodynamic stenosis as per invasive angiography/hemodynamics. Continue Toprol-XL 12.5 mg p.o. daily, unable to tolerate 25 mg p.o. daily due to fatigue. Reviewed and discussed results of ambulatory cardiac telemetry.  Formal results pending review by Dr. Terri Skains.  Discussed with patient single episode of NSVT and occasional episodes of SVT, all of which were asymptomatic. Low suspicion for cardiac etiology of patient's syncopal episodes, possibly consider neurology evaluation.  Nonobstructive coronary artery disease without angina pectoris: CAC 371, 96th percentile Underwent left heart catheterization and was noted to have no hemodynamically significant stenosis as of 02/2020. Educated on the importance of secondary prevention. Continue Toprol-XL 12.5 mg p.o. daily LDL uncontrolled at 96, LDL goal  <70 Increase Crestor from 5 mg to 20 mg p.o. daily Repeat lipid profile testing in 8 to 12 weeks.  Non-insulin-dependent diabetes mellitus type 2: Currently managed by primary care provider.  Hypertension: Well-controlled Continue amlodipine, metoprolol, Maxide Continue to monitor  Hyperlipidemia, mixed: Patient has presently been taking 5 mg p.o. daily, will increase to 20 mg p.o. nightly LDL goal <70 in the setting of diabetes.  FINAL MEDICATION LIST END OF ENCOUNTER: Meds ordered this encounter  Medications   DISCONTD: rosuvastatin (CRESTOR) 10 MG tablet    Sig: Take 1 tablet (10 mg total) by mouth at bedtime.    Dispense:  90 tablet    Refill:  0   rosuvastatin (CRESTOR) 20 MG tablet    Sig: Take 1 tablet (20 mg total) by mouth daily.    Dispense:  90 tablet    Refill:  3     Current Outpatient Medications:    amLODipine (NORVASC) 5 MG tablet, Take 5 mg by mouth daily., Disp: , Rfl:    aspirin 81 MG chewable tablet, Chew 81 mg by mouth daily., Disp: , Rfl:    baclofen (LIORESAL) 10 MG tablet, Take 10 mg by mouth 2 (two) times daily., Disp: , Rfl:    Coenzyme Q10 (COQ-10 PO), Take 1 capsule by mouth daily at 12 noon., Disp: , Rfl:    metFORMIN (GLUCOPHAGE) 500 MG tablet, Take 1 tablet (500 mg total) by mouth in the morning and at bedtime., Disp: , Rfl:    metoprolol succinate (TOPROL XL) 25 MG 24 hr tablet, Take 1 tablet (25 mg total) by mouth daily., Disp: 30 tablet, Rfl: 0   Multiple Vitamin (MULTIVITAMIN WITH MINERALS) TABS tablet, Take 1 tablet by mouth daily., Disp: , Rfl:    nitroGLYCERIN (NITROSTAT) 0.4 MG SL tablet, PLACE 1 TABLET UNDER THE TONGUE EVERY 5 MINUTES AS NEEDED CHEST PAIN. IF YOU REQUIRE MORE THAN 2 TABLETS 5 MINUTES APART GO TO ER., Disp: 25 tablet, Rfl: 0   Omega-3 Fatty Acids (FISH OIL PO), Take 1,600 mg by mouth daily., Disp: , Rfl:    omeprazole (PRILOSEC) 40 MG capsule, Take 40 mg by mouth daily., Disp: , Rfl:    rosuvastatin (CRESTOR) 20 MG  tablet, Take 1 tablet (20 mg total) by mouth daily., Disp: 90 tablet, Rfl: 3   triamterene-hydrochlorothiazide (MAXZIDE-25) 37.5-25 MG tablet, Take  0.5 tablets by mouth daily., Disp: , Rfl:   Orders Placed This Encounter  Procedures   Lipid Panel With LDL/HDL Ratio    There are no Patient Instructions on file for this visit.   --Continue cardiac medications as reconciled in final medication list. --Return in about 3 months (around 01/28/2021) for HLD, CAD, syncope . Or sooner if needed. --Continue follow-up with your primary care physician regarding the management of your other chronic comorbid conditions.  Patient's questions and concerns were addressed to his satisfaction. He voices understanding of the instructions provided during this encounter.   This note was created using a voice recognition software as a result there may be grammatical errors inadvertently enclosed that do not reflect the nature of this encounter. Every attempt is made to correct such errors.  Alethia Berthold, [name] 10/28/2020, 4:05 PM Office: [phone]

## 2020-10-30 ENCOUNTER — Other Ambulatory Visit: Payer: Self-pay | Admitting: Cardiology

## 2020-10-30 DIAGNOSIS — I2510 Atherosclerotic heart disease of native coronary artery without angina pectoris: Secondary | ICD-10-CM

## 2021-03-07 ENCOUNTER — Other Ambulatory Visit: Payer: Self-pay

## 2021-03-07 ENCOUNTER — Encounter: Payer: Self-pay | Admitting: Cardiology

## 2021-03-07 ENCOUNTER — Ambulatory Visit: Payer: 59 | Admitting: Cardiology

## 2021-03-07 VITALS — BP 124/82 | HR 77 | Temp 98.3°F | Resp 17 | Ht 73.0 in | Wt 184.0 lb

## 2021-03-07 DIAGNOSIS — E119 Type 2 diabetes mellitus without complications: Secondary | ICD-10-CM

## 2021-03-07 DIAGNOSIS — I10 Essential (primary) hypertension: Secondary | ICD-10-CM

## 2021-03-07 DIAGNOSIS — E782 Mixed hyperlipidemia: Secondary | ICD-10-CM

## 2021-03-07 DIAGNOSIS — I2510 Atherosclerotic heart disease of native coronary artery without angina pectoris: Secondary | ICD-10-CM

## 2021-03-07 NOTE — Progress Notes (Signed)
Date:  03/07/2021   ID:  Arthur Sanders, DOB 1960-04-18, MRN [id number]  PCP:  Rogers Blocker, MD  Cardiologist:  Rex Kras, DO, [facility] (established care 01/20/2020)  Date: 03/07/21 Last Office Visit: 09/30/2020  Chief Complaint  Patient presents with   Follow-up    1 year   Coronary Artery Disease    HPI  Arthur Sanders is a 61 y.o. male who presents to the office with a chief complaint of " loss of consciousness." Patient's past medical history and cardiovascular risk factors include: Moderate coronary artery disease with calcified coronary atherosclerosis, Hypertension, hyperlipidemia, NIDDM Type II, GERD.  Patient was referred to the practice by his PCP for evaluation of chest pain and since then has undergone extensive ischemic work-up as outlined below including echo, coronary CTA followed by left heart catheterization.  Given his nonobstructive CAD he has been managed medically with focusing on improving his modifiable cardiovascular risk factors.  He now presents for 31-monthfollow-up visit.  Since last office visit he is doing well from a cardiovascular standpoint.  He denies angina pectoris or heart failure symptoms.  No hospitalizations or urgent care visits for cardiovascular symptoms.  He is currently taking his home medications as prescribed.  He takes metoprolol only if needed.  No use of sublingual nitroglycerin tablets.  He recently had labs with his PCP less than a month ago, will obtain the records from his PCPs office.  FUNCTIONAL STATUS: Exercises three days a week (sit up, push up, planking).    ALLERGIES: Allergies  Allergen Reactions   Egg [Eggs Or Egg-Derived Products]     MEDICATION LIST PRIOR TO VISIT: Current Meds  Medication Sig   amLODipine (NORVASC) 5 MG tablet Take 5 mg by mouth daily.   aspirin 81 MG chewable tablet Chew 81 mg by mouth daily.   baclofen (LIORESAL) 10 MG tablet Take 10 mg by mouth 2 (two) times daily.   Coenzyme Q10 (COQ-10  PO) Take 1 capsule by mouth daily at 12 noon.   metFORMIN (GLUCOPHAGE-XR) 500 MG 24 hr tablet Take 500 mg by mouth 2 (two) times daily.   metoprolol succinate (TOPROL-XL) 25 MG 24 hr tablet TAKE 1 TABLET (25 MG TOTAL) BY MOUTH DAILY. (Patient taking differently: Take 25 mg by mouth as needed.)   Multiple Vitamin (MULTIVITAMIN WITH MINERALS) TABS tablet Take 1 tablet by mouth daily.   nitroGLYCERIN (NITROSTAT) 0.4 MG SL tablet PLACE 1 TABLET UNDER THE TONGUE EVERY 5 MINUTES AS NEEDED CHEST PAIN. IF YOU REQUIRE MORE THAN 2 TABLETS 5 MINUTES APART GO TO ER.   Omega-3 Fatty Acids (FISH OIL PO) Take 1,600 mg by mouth daily.   omeprazole (PRILOSEC) 40 MG capsule Take 40 mg by mouth as needed.   ONGLYZA 2.5 MG TABS tablet Take 2.5 mg by mouth daily.   rosuvastatin (CRESTOR) 20 MG tablet Take 1 tablet (20 mg total) by mouth daily.   triamterene-hydrochlorothiazide (MAXZIDE-25) 37.5-25 MG tablet Take 0.5 tablets by mouth daily.     PAST MEDICAL HISTORY: Past Medical History:  Diagnosis Date   Allergy    Coronary artery disease    Diabetes mellitus without complication ([place]    Hyperlipidemia    Hypertension     PAST SURGICAL HISTORY: Past Surgical History:  Procedure Laterality Date   INTRAVASCULAR PRESSURE WIRE/FFR STUDY N/A 02/16/2020   Procedure: INTRAVASCULAR PRESSURE WIRE/FFR STUDY;  Surgeon: GAdrian Prows MD;  [facility];  Service: Cardiovascular;  Laterality: N/A;  LEFT HEART CATH AND CORONARY ANGIOGRAPHY N/A 02/16/2020   Procedure: LEFT HEART CATH AND CORONARY ANGIOGRAPHY;  Surgeon: Adrian Prows, MD;  Location: [facility];  Service: Cardiovascular;  Laterality: N/A;     FAMILY HISTORY: The patient family history includes Cancer (age of onset: 64) in his father; Diabetes in his father, mother, and sister; Hyperlipidemia in his sister; Hypertension in his father and mother; Stroke in his sister.  SOCIAL HISTORY:  The patient  reports that he has never smoked. He has  never used smokeless tobacco. He reports current alcohol use. He reports that he does not use drugs.  REVIEW OF SYSTEMS: Review of Systems  Cardiovascular:  Negative for chest pain, cyanosis, dyspnea on exertion, leg swelling, orthopnea, palpitations and syncope.  Respiratory:  Negative for shortness of breath.    PHYSICAL EXAM: Vitals with BMI 03/07/2021 10/28/2020 09/30/2020  Height _0  _1  _2   Weight 184 lbs 192 lbs 3 oz 190 lbs  BMI 24.28 02.72 53.66  Systolic 440 347 425  Diastolic 82 76 82  Pulse 77 60 76    CONSTITUTIONAL: Well-developed and well-nourished. No acute distress.  SKIN: Skin is warm and dry. No rash noted. No cyanosis. No pallor. No jaundice HEAD: Normocephalic and atraumatic.  EYES: No scleral icterus MOUTH/THROAT: Moist oral membranes.  NECK: No JVD present. No thyromegaly noted. No carotid bruits  LYMPHATIC: No visible cervical adenopathy.  CHEST Normal respiratory effort. No intercostal retractions  LUNGS: Clear to auscultation bilaterally.  No stridor. No wheezes. No rales.  CARDIOVASCULAR: Regular rate and rhythm, positive S1-S2, no murmurs rubs or gallops appreciated ABDOMINAL: No apparent ascites.  EXTREMITIES: No peripheral edema, 2+ dorsalis pedis and posterior tibial pulses, warm to touch. HEMATOLOGIC: No significant bruising NEUROLOGIC: Oriented to person, place, and time. Nonfocal. Normal muscle tone.  PSYCHIATRIC: Normal mood and affect. Normal behavior. Cooperative  CARDIAC DATABASE: EKG: 03/07/2021: NSR, 67 bpm, nonspecific T wave abnormality.    Echocardiogram: 01/28/2020:  Normal LV systolic function with visual EF 60-65%.  Left ventricle cavity is normal in size. Normal global wall motion. Normal diastolic filling pattern, normal LAP.  Mild tricuspid regurgitation. No evidence of pulmonary hypertension.  No other significant valvular heart disease.  No prior study for comparison.   Stress Testing: No results found for this or  any previous visit from the past 1095 days.  Coronary CTA: 01/26/2020  1. Coronary calcium score of 371. This was 96th percentile for age and sex matched control. 2. Normal coronary origin with right dominance. 3. CAD-RADS = 3. Moderate stenosis within proximal LAD, Moderate stenosis within proximal LCx. Mild stenosis within proximal RCA. 4. Study will not be sent for CT-FFR due to artifact from frequent PVCs and misregistration. Consider invasive coronary angiography, if clinically indicated. Non-cardiac portion: Negative over-read examination  Heart Catheterization: Left Heart Catheterization and DFR to LAD and RCA 02/16/20:  LV: Normal LV systolic function, EDP 6 mmHg.  No pressure gradient across the aortic valve.  LVEF 55%. Left main: Large vessel.  No significant disease. LAD: Large vessel.  Proximal segment has a eccentric 50% stenosis.  There are several small to moderate-sized diagonals.  The stenosis is irregular.  No ulceration noted. Circumflex: Large caliber vessel.  Gives origin to 2 large OM branches.  There is mild disease in the midsegment constituting 20 to 30% stenosis. RCA: Dominant vessel.  Proximal segment has a small eccentric 60% stenosis.   DFR to proximal LAD = 0.92 and proximal RCA 0.95,  not hemodynamically significant.   Recommendation: Aggressive risk factor modification is indicated.  Some of this symptoms of angina could be related to coronary spasm and could consider discontinuing diuretics and switching to amlodipine.  70 mL contrast utilized.  LABORATORY DATA: CBC Latest Ref Rng & Units 02/16/2020 02/16/2020 01/16/2020  WBC 4.0 - 10.5 K/uL - 5.1 6.9  Hemoglobin 13.0 - 17.0 g/dL 14.6 13.8 12.9(L)  Hematocrit 39.0 - 52.0 % 43.0 43.1 39.3  Platelets 150 - 400 K/uL - 217 242    CMP Latest Ref Rng & Units 02/16/2020 02/16/2020 01/16/2020  Glucose 70 - 99 mg/dL 178(H) 186(H) 179(H)  BUN 6 - 20 mg/dL _0 Creatinine 0.61 - 1.24 mg/dL 1.50(H) 1.56(H) 1.40(H)   Sodium 135 - 145 mmol/L 135 135 133(L)  Potassium 3.5 - 5.1 mmol/L 3.7 3.5 3.7  Chloride 98 - 111 mmol/L 93(L) 95(L) 96(L)  CO2 22 - 32 mmol/L - 27 26  Calcium 8.9 - 10.3 mg/dL - 10.0 9.7  Total Protein 6.0 - 8.3 g/dL - - -  Total Bilirubin 0.3 - 1.2 mg/dL - - -  Alkaline Phos 39 - 117 U/L - - -  AST 0 - 37 U/L - - -  ALT 0 - 53 U/L - - -    No components found for: NTPROBNP No results for input(s): PROBNP in the last 8760 hours. No results for input(s): TSH in the last 8760 hours.  BMP No results for input(s): NA, K, CL, CO2, GLUCOSE, BUN, CREATININE, CALCIUM, GFRNONAA, GFRAA in the last 8760 hours.   HEMOGLOBIN A1C No results found for: HGBA1C, MPG  External Labs: Collected: 12/04/2019, outside labs received from PCP Creatinine 1.42 mg/dL. eGFR: 63 mL/min per 1.73 m Potassium 4.2 Lipid profile: Total cholesterol 153, triglycerides 97, HDL 41, LDL 92, non-HDL 111 AST 20, ALT 18 Hemoglobin A1c: 8.5  IMPRESSION:    ICD-10-CM   1. Coronary atherosclerosis due to calcified coronary lesion  I25.10 EKG 12-Lead   I25.84     2. Nonobstructive atherosclerosis of coronary artery  I25.10     3. Non-insulin dependent type 2 diabetes mellitus ([place])  E11.9     4. Benign hypertension  I10 EKG 12-Lead    5. Mixed hyperlipidemia  E78.2        RECOMMENDATIONS:  Arthur Sanders is a 61 y.o. male whose past medical history and cardiac risk factors include: Moderate coronary artery disease with calcified coronary atherosclerosis, Hypertension, hyperlipidemia, NIDDM Type II, GERD.  Nonobstructive atherosclerosis of coronary artery /moderate CAC Chronic and stable. Total CAC 371, 96 percentile. Has undergone ischemic evaluation as outlined above which was reviewed as part of medical decision making at today's office visit. Medications reconciled. Recently had labs at PCPs office, records requested. EKG: Normal sinus without underlying injury pattern. No use of sublingual  nitroglycerin tablets since the last office encounter. No reoccurrence of syncope since last office visit. Educated importance of improving his modifiable cardiovascular risk factors. Continue aspirin and statin therapy.  Non-insulin dependent type 2 diabetes mellitus ([place]) He has been started on additional diabetes medications according to the patient. Educated the importance of glycemic control given his other cardiovascular risk factors. Currently managed by primary care provider.  Benign hypertension Office blood pressures are very well controlled. Medications reconciled. Currently using Toprol-XL and as needed basis.   Initially given to him for anginal discomfort and PVCs.  As long as his blood pressures are well controlled hold off Toprol XL as it is  not an ideal BP med.   Mixed hyperlipidemia Currently on rosuvastatin.   He denies myalgia or other side effects. Most recent labs requested.    FINAL MEDICATION LIST END OF ENCOUNTER: No orders of the defined types were placed in this encounter.    Current Outpatient Medications:    amLODipine (NORVASC) 5 MG tablet, Take 5 mg by mouth daily., Disp: , Rfl:    aspirin 81 MG chewable tablet, Chew 81 mg by mouth daily., Disp: , Rfl:    baclofen (LIORESAL) 10 MG tablet, Take 10 mg by mouth 2 (two) times daily., Disp: , Rfl:    Coenzyme Q10 (COQ-10 PO), Take 1 capsule by mouth daily at 12 noon., Disp: , Rfl:    metFORMIN (GLUCOPHAGE-XR) 500 MG 24 hr tablet, Take 500 mg by mouth 2 (two) times daily., Disp: , Rfl:    metoprolol succinate (TOPROL-XL) 25 MG 24 hr tablet, TAKE 1 TABLET (25 MG TOTAL) BY MOUTH DAILY. (Patient taking differently: Take 25 mg by mouth as needed.), Disp: 30 tablet, Rfl: 0   Multiple Vitamin (MULTIVITAMIN WITH MINERALS) TABS tablet, Take 1 tablet by mouth daily., Disp: , Rfl:    nitroGLYCERIN (NITROSTAT) 0.4 MG SL tablet, PLACE 1 TABLET UNDER THE TONGUE EVERY 5 MINUTES AS NEEDED CHEST PAIN. IF YOU REQUIRE MORE  THAN 2 TABLETS 5 MINUTES APART GO TO ER., Disp: 25 tablet, Rfl: 0   Omega-3 Fatty Acids (FISH OIL PO), Take 1,600 mg by mouth daily., Disp: , Rfl:    omeprazole (PRILOSEC) 40 MG capsule, Take 40 mg by mouth as needed., Disp: , Rfl:    ONGLYZA 2.5 MG TABS tablet, Take 2.5 mg by mouth daily., Disp: , Rfl:    rosuvastatin (CRESTOR) 20 MG tablet, Take 1 tablet (20 mg total) by mouth daily., Disp: 90 tablet, Rfl: 3   triamterene-hydrochlorothiazide (MAXZIDE-25) 37.5-25 MG tablet, Take 0.5 tablets by mouth daily., Disp: , Rfl:   Orders Placed This Encounter  Procedures   EKG 12-Lead    There are no Patient Instructions on file for this visit.   --Continue cardiac medications as reconciled in final medication list. --Return in about 13 months (around 04/16/2022) for Follow up, CAD, after his annual physical with PCP.. Or sooner if needed. --Continue follow-up with your primary care physician regarding the management of your other chronic comorbid conditions.  Patient's questions and concerns were addressed to his satisfaction. He voices understanding of the instructions provided during this encounter.   This note was created using a voice recognition software as a result there may be grammatical errors inadvertently enclosed that do not reflect the nature of this encounter. Every attempt is made to correct such errors.  As part of today's office visit we discussed management of 2 chronic comorbid conditions, reviewed 2 independent/unique testing results such as labs/echo/heart catheterization report and additional diagnostic testing was ordered for follow-up care.  Disease management discussed patient's questions and concerns were addressed in coordination of care.  Rex Kras, [place], [facility]  Pager: [phone] Office: [phone]

## 2021-03-24 ENCOUNTER — Other Ambulatory Visit: Payer: Self-pay

## 2021-03-24 DIAGNOSIS — I2510 Atherosclerotic heart disease of native coronary artery without angina pectoris: Secondary | ICD-10-CM

## 2021-03-24 DIAGNOSIS — E782 Mixed hyperlipidemia: Secondary | ICD-10-CM

## 2021-04-27 IMAGING — CT CT HEART MORP W/ CTA COR W/ SCORE W/ CA W/CM &/OR W/O CM
2 of 7 series · 11 of 20 positions shown, 13 images · IV contrast (APPLIED)
Comparison: None.
COMPARISON: None.

Addendum:
EXAM:
OVER-READ INTERPRETATION  CT CHEST

The following report is an over-read performed by radiologist Dr.
Lusine Jim [[email or web address]] on 01/26/2020. This over-read
does not include interpretation of cardiac or coronary anatomy or
pathology. The coronary calcium score/coronary CTA interpretation by
the cardiologist is attached.
HISTORY: 60 y.o. male whose past medical history and cardiac risk factors
include: Hypertension, hyperlipidemia, NIDDM Type II, GERD has gone
to ER several times for chest pain evaluation. Now presents again
for precordial pain.
Cardiac/Coronary  CT
TECHNIQUE: The patient was scanned on a Siemens Force scanner.
PROTOCOL: A 120 kV retrospective scan was triggered in the descending thoracic
aorta at 111 HU's. Axial non-contrast 3 mm slices were carried out
through the heart. The data set was analyzed on a dedicated work
station and scored using the Agatson method. Gantry rotation speed
was 250 msecs and collimation was .6 mm. No IV beta blockade but
mg of sl NTG was given. The 3D data set was reconstructed in 5%
intervals of the 67-82 % of the R-R cycle. Diastolic phases were
analyzed on a dedicated work station using MPR, MIP and VRT modes.
The patient received 80mL OMNIPAQUE IOHEXOL 350 MG/ML SOLN of
contrast.

[Series 8: 0-90% · axial · 0.39mm/px · z∈[-246,-162]mm · 5 of 2090 slices shown]
[im 349/2090  vessel]
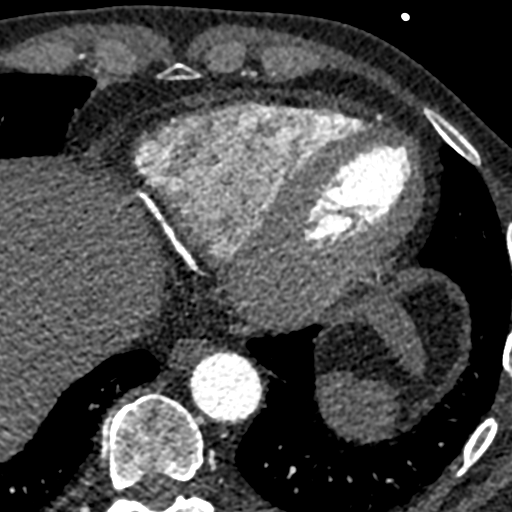
[im 697/2090  vessel]
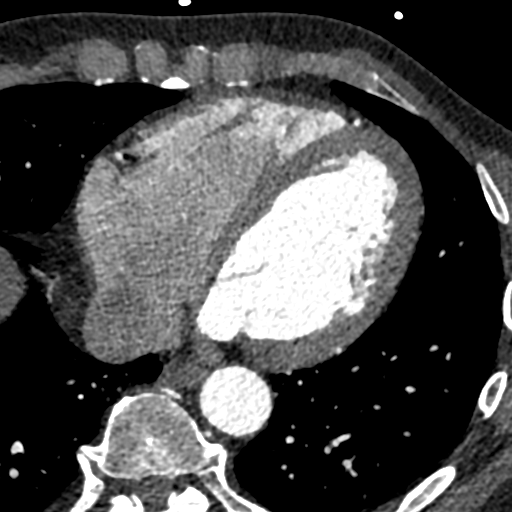
[im 1045/2090  vessel]
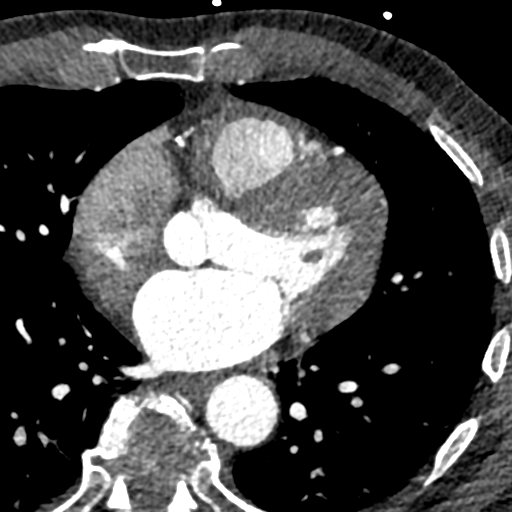
[im 1393/2090  vessel]
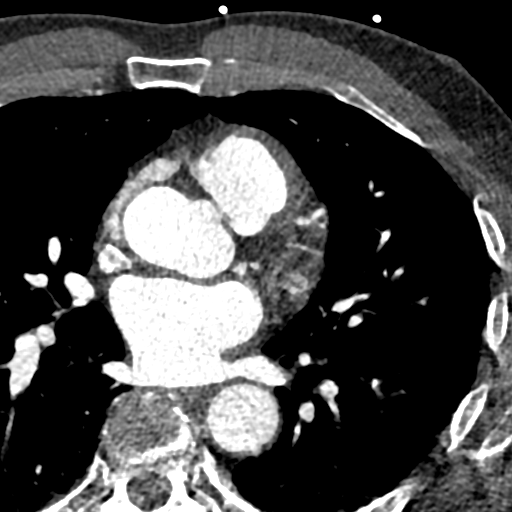
[im 1741/2090  vessel]
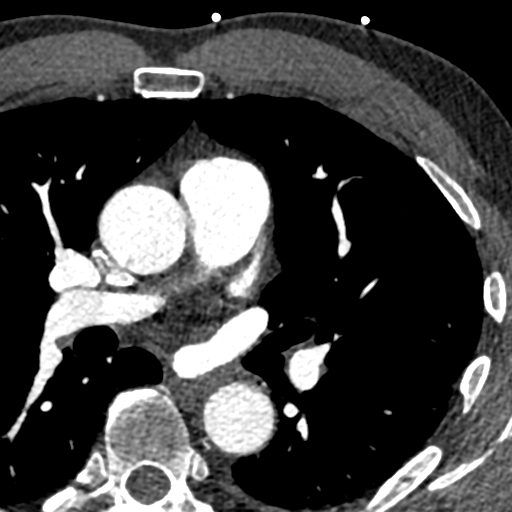

[Series 9: 5-95% · axial · 0.39mm/px · z∈[-249,-159]mm · 6 of 2090 slices shown, 8 images]
[im 299/2090  vessel]
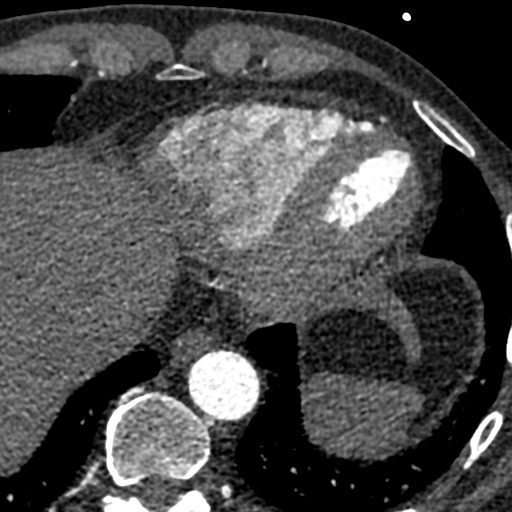
[im 299/2090  lung]
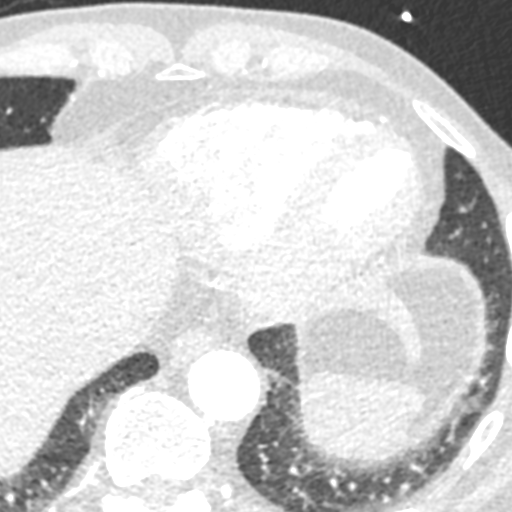
[im 597/2090  vessel]
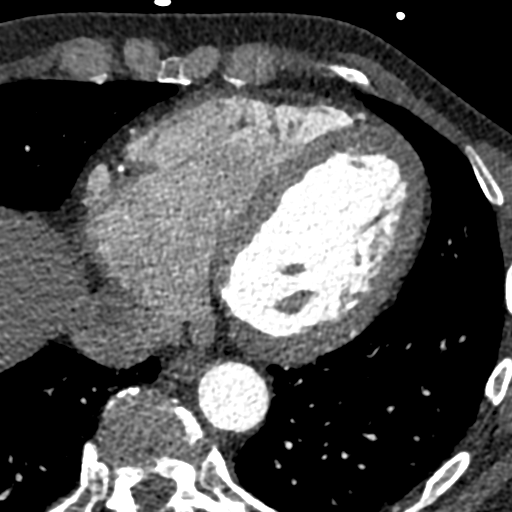
[im 896/2090  vessel]
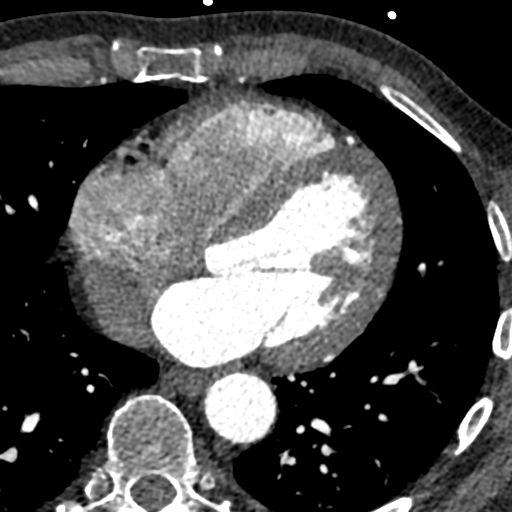
[im 1194/2090  vessel]
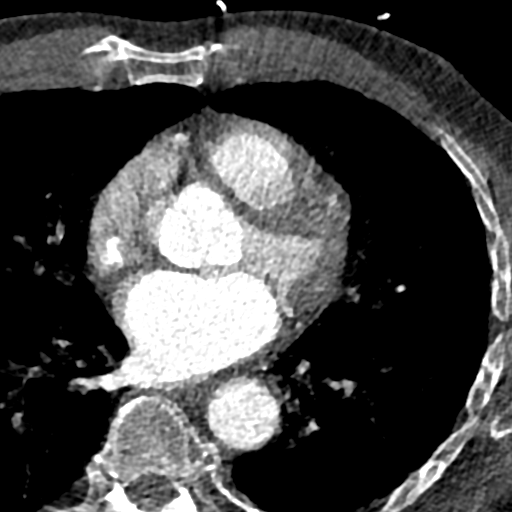
[im 1493/2090  vessel]
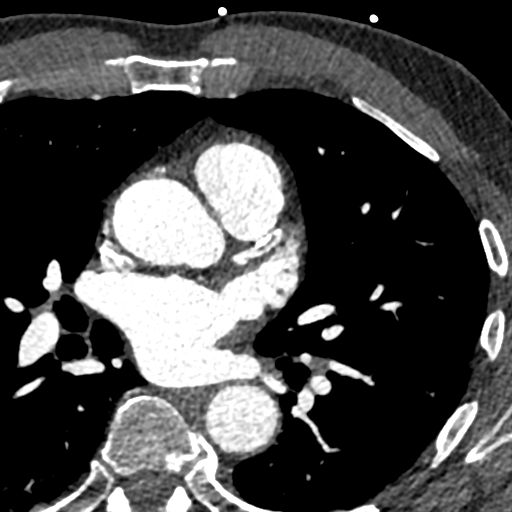
[im 1493/2090  lung]
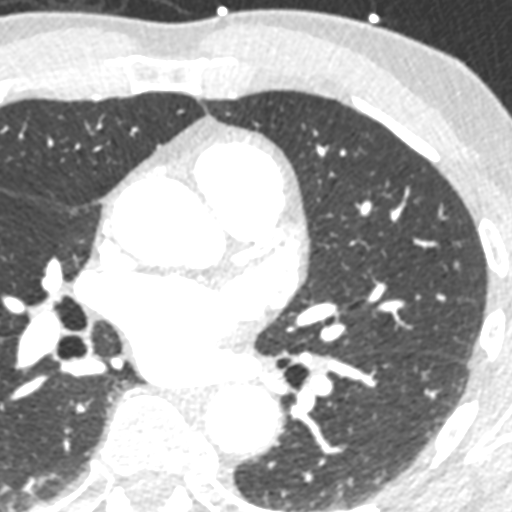
[im 1791/2090  vessel]
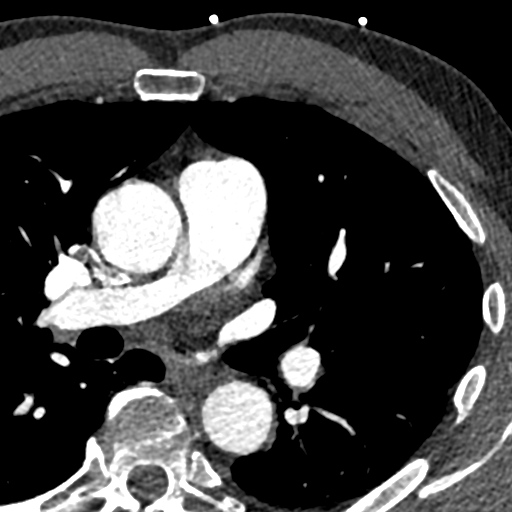

[11 of 20 positions shown; findings below may reference images not displayed]

FINDINGS: Vascular: Main pulmonary arteries are patent. Normal caliber of the
visualized thoracic aorta.

Mediastinum/Nodes: Visualized mediastinal and hilar structures are
unremarkable.

Lungs/Pleura: Visualized lungs are clear. No large pleural
effusions.

Upper Abdomen: Visualized upper abdominal structures are
unremarkable.

Musculoskeletal: No acute bone abnormality.
IMPRESSION: Negative over-read examination.
FINDINGS: Image quality: Average.

Noise artifact is: Moderate.

Coronary artery calcification score:

Left main: 0

Left anterior descending artery: 245

Left circumflex artery: 9

Right coronary artery: 117

Total calcium score: 371 CHOLLA

Coronary calcium score is 371, which places the patient in the 96th
percentile for age and sex matched control.

Coronary arteries: Normal coronary origins.  Right dominance.

Left Main Coronary Artery: The left main is a large caliber vessel
with a normal take off from the left coronary cusp that bifurcates
to form a left anterior descending artery and a left circumflex
artery. There is no plaque or stenosis.

Left Anterior Descending Coronary Artery: Normal caliber vessel that
gives rise to 2 patent diagonal branches and reaches the apex.
Moderate stenosis (50-69%) in the proximal LAD due to mixed plaque.
Mild stenosis (25-49%) in the mid/distal LAD due to noncalcified
plaque. Otherwise the remainder of the vessel is patent. No
significant disease within the two diagonal branches.

Left Circumflex Artery: Non-dominant vessel that travels within the
atrioventricular groove and give rise to two obtuse marginal
branches. Moderate stenosis (50-69%) due to noncalcified plaque in
the proximal LCX. Minimal stenosis (0-24%) due to calcified plaque
in the mid LCX. OM 1 is overall patent.

Right Coronary Artery: The RCA is dominant with normal take off from
the right coronary cusp and terminates as a PDA and right
posterolateral branch. Mild stenosis (25-49%) due to mixed plaque
within proximal RCA, mid to distal segment patent with minimal
luminal irregularities.

Left Atrium: Left atrial size is grossly within normal limits and no
left atrial appendage filling defect.

Left Ventricle: The ventricular cavity size is grossly within normal
limits. There are no stigmata of prior infarction. There is no
abnormal filling defect.

Pulmonary arteries: Normal in size without proximal filling defect.

Pulmonary veins: Normal pulmonary venous drainage.

Aorta: Normal size, 36 mm at the mid ascending aorta (level of the
PA bifurcation) measured double oblique. No calcifications. No
dissection.

Pericardium: Normal thickness with no significant effusion or
calcium present.

Cardiac valves: The aortic valve is trileaflet without significant
calcification. The mitral valve is normal structure without
significant calcification.

Extra-cardiac findings: See attached radiology report for
non-cardiac structures.
IMPRESSION: 1. Coronary calcium score of 371. This was 96th percentile for age
and sex matched control.

2. Normal coronary origin with right dominance.

3. CAD-RADS = 3. Moderate stenosis within proximal LAD, Moderate
stenosis within proximal LCx. Mild stenosis within proximal RCA.

4. Study will not be sent for CT-FFR due to artifact from frequent
PVCs and misregistration. Consider invasive coronary angiography, if
clinically indicated.

RECOMMENDATIONS:

Moderate stenosis. Consider symptom-guided anti-ischemic
pharmacotherapy as well as risk factor modification per guideline
directed care.

*** End of Addendum ***
EXAM:
OVER-READ INTERPRETATION  CT CHEST

The following report is an over-read performed by radiologist Dr.
Lusine Jim [[email or web address]] on 01/26/2020. This over-read
does not include interpretation of cardiac or coronary anatomy or
pathology. The coronary calcium score/coronary CTA interpretation by
the cardiologist is attached.
FINDINGS: Vascular: Main pulmonary arteries are patent. Normal caliber of the
visualized thoracic aorta.

Mediastinum/Nodes: Visualized mediastinal and hilar structures are
unremarkable.

Lungs/Pleura: Visualized lungs are clear. No large pleural
effusions.

Upper Abdomen: Visualized upper abdominal structures are
unremarkable.

Musculoskeletal: No acute bone abnormality.
IMPRESSION: Negative over-read examination.

## 2021-05-11 ENCOUNTER — Encounter (HOSPITAL_BASED_OUTPATIENT_CLINIC_OR_DEPARTMENT_OTHER): Payer: Self-pay | Admitting: Emergency Medicine

## 2021-05-11 ENCOUNTER — Emergency Department (HOSPITAL_BASED_OUTPATIENT_CLINIC_OR_DEPARTMENT_OTHER): Payer: 59

## 2021-05-11 ENCOUNTER — Other Ambulatory Visit: Payer: Self-pay

## 2021-05-11 ENCOUNTER — Emergency Department (HOSPITAL_BASED_OUTPATIENT_CLINIC_OR_DEPARTMENT_OTHER)
Admission: EM | Admit: 2021-05-11 | Discharge: 2021-05-11 | Disposition: A | Discharge status: Home / Self Care | Payer: 59 | Attending: Emergency Medicine | Admitting: Emergency Medicine

## 2021-05-11 DIAGNOSIS — T50905A Adverse effect of unspecified drugs, medicaments and biological substances, initial encounter: Secondary | ICD-10-CM | POA: Insufficient documentation

## 2021-05-11 DIAGNOSIS — R079 Chest pain, unspecified: Secondary | ICD-10-CM | POA: Insufficient documentation

## 2021-05-11 DIAGNOSIS — N289 Disorder of kidney and ureter, unspecified: Secondary | ICD-10-CM | POA: Diagnosis not present

## 2021-05-11 DIAGNOSIS — R072 Precordial pain: Secondary | ICD-10-CM

## 2021-05-11 DIAGNOSIS — Z7982 Long term (current) use of aspirin: Secondary | ICD-10-CM | POA: Insufficient documentation

## 2021-05-11 DIAGNOSIS — X58XXXA Exposure to other specified factors, initial encounter: Secondary | ICD-10-CM | POA: Insufficient documentation

## 2021-05-11 DIAGNOSIS — R251 Tremor, unspecified: Secondary | ICD-10-CM | POA: Diagnosis not present

## 2021-05-11 LAB — CBC WITH DIFFERENTIAL/PLATELET
Abs Immature Granulocytes: 0.05 10*3/uL (ref 0.00–0.07)
Basophils Absolute: 0.1 10*3/uL (ref 0.0–0.1)
Basophils Relative: 1 %
Eosinophils Absolute: 0.3 10*3/uL (ref 0.0–0.5)
Eosinophils Relative: 4 %
HCT: 39.9 % (ref 39.0–52.0)
Hemoglobin: 13 g/dL (ref 13.0–17.0)
Immature Granulocytes: 1 %
Lymphocytes Relative: 40 %
Lymphs Abs: 3.3 10*3/uL (ref 0.7–4.0)
MCH: 27.9 pg (ref 26.0–34.0)
MCHC: 32.6 g/dL (ref 30.0–36.0)
MCV: 85.6 fL (ref 80.0–100.0)
Monocytes Absolute: 1.1 10*3/uL — ABNORMAL HIGH (ref 0.1–1.0)
Monocytes Relative: 14 %
Neutro Abs: 3.3 10*3/uL (ref 1.7–7.7)
Neutrophils Relative %: 40 %
Platelets: 251 10*3/uL (ref 150–400)
RBC: 4.66 MIL/uL (ref 4.22–5.81)
RDW: 12.3 % (ref 11.5–15.5)
WBC: 8.2 10*3/uL (ref 4.0–10.5)
nRBC: 0 % (ref 0.0–0.2)

## 2021-05-11 LAB — BASIC METABOLIC PANEL
Anion gap: 13 (ref 5–15)
BUN: 18 mg/dL (ref 6–20)
CO2: 24 mmol/L (ref 22–32)
Calcium: 9.3 mg/dL (ref 8.9–10.3)
Chloride: 96 mmol/L — ABNORMAL LOW (ref 98–111)
Creatinine, Ser: 1.54 mg/dL — ABNORMAL HIGH (ref 0.61–1.24)
GFR, Estimated: 52 mL/min — ABNORMAL LOW (ref >60)
Glucose, Bld: 156 mg/dL — ABNORMAL HIGH (ref 70–99)
Potassium: 3.5 mmol/L (ref 3.5–5.1)
Sodium: 133 mmol/L — ABNORMAL LOW (ref 135–145)

## 2021-05-11 LAB — RAPID URINE DRUG SCREEN, HOSP PERFORMED
Amphetamines: NOT DETECTED
Barbiturates: NOT DETECTED
Benzodiazepines: NOT DETECTED
Cocaine: NOT DETECTED
Opiates: NOT DETECTED
Tetrahydrocannabinol: POSITIVE — AB

## 2021-05-11 LAB — URINALYSIS, ROUTINE W REFLEX MICROSCOPIC
Bilirubin Urine: NEGATIVE
Glucose, UA: NEGATIVE mg/dL
Hgb urine dipstick: NEGATIVE
Ketones, ur: NEGATIVE mg/dL
Leukocytes,Ua: NEGATIVE
Nitrite: NEGATIVE
Protein, ur: NEGATIVE mg/dL
Specific Gravity, Urine: 1.015 (ref 1.005–1.030)
pH: 7 (ref 5.0–8.0)

## 2021-05-11 LAB — ACETAMINOPHEN LEVEL: Acetaminophen (Tylenol), Serum: <10 ug/mL — ABNORMAL LOW (ref 10–30)

## 2021-05-11 LAB — TROPONIN I (HIGH SENSITIVITY)
Troponin I (High Sensitivity): 4 ng/L (ref <18)
Troponin I (High Sensitivity): 4 ng/L (ref <18)

## 2021-05-11 LAB — SALICYLATE LEVEL: Salicylate Lvl: <7 mg/dL — ABNORMAL LOW (ref 7.0–30.0)

## 2021-05-11 MED ORDER — SODIUM CHLORIDE 0.9 % IV BOLUS
500.0000 mL | Freq: Once | INTRAVENOUS | Status: AC
  Administered 2021-05-11: 500 mL via INTRAVENOUS

## 2021-05-11 MED ORDER — ACETAMINOPHEN 500 MG PO TABS
1000.0000 mg | ORAL_TABLET | Freq: Once | ORAL | Status: AC
  Administered 2021-05-11: 1000 mg via ORAL
  Filled 2021-05-11: qty 2

## 2021-05-11 MED ORDER — SODIUM BICARBONATE 8.4 % IV SOLN
50.0000 meq | Freq: Once | INTRAVENOUS | Status: AC
  Administered 2021-05-11: 50 meq via INTRAVENOUS
  Filled 2021-05-11: qty 50

## 2021-05-11 MED ORDER — SODIUM CHLORIDE 0.9 % IV BOLUS
500.0000 mL | Freq: Once | INTRAVENOUS | Status: AC
Start: 2021-05-11 — End: 2021-05-11
  Administered 2021-05-11: 500 mL via INTRAVENOUS

## 2021-05-11 NOTE — ED Notes (Addendum)
Patient again informed of need for urine sample. Unable to provide one at this time. Provider aware. Patient provided water, per provider order. ?

## 2021-05-11 NOTE — ED Provider Notes (Addendum)
?[facility] EMERGENCY DEPARTMENT ?Provider Note ? ? ?CSN: [id number] ?Arrival date & time: 05/11/21  0221 ? ?  ? ?History ? ?Chief Complaint  ?Patient presents with  ? Chest Pain  ? ? ?Arthur Sanders is a 61 y.o. male. ? ?The history is limited by the condition of the patient (somnolent and unable to stay on topic).  ?Chest Pain ?Pain location:  L chest ?Pain quality: dull   ?Pain radiates to:  Does not radiate ?Pain severity:  Moderate ?Onset quality:  Gradual ?Duration:  20 minutes ?Timing:  Constant ?Progression:  Partially resolved ?Context: at rest   ?Context comment:  After taking 2 CBD gummies, also is having tremors since taking the CBD gummies. ?Relieved by:  Nothing ?Worsened by:  Nothing ?Ineffective treatments:  Nitroglycerin ?Associated symptoms: no back pain, no claudication, no cough, no fever, no heartburn, no lower extremity edema, no numbness, no PND, no shortness of breath, no syncope, no vomiting and no weakness   ?Associated symptoms comment:  Sensation HR is racing with HR of 89 ?Risk factors: male sex   ?Patient with HTN and type 2 diabetic presents with sensation Heart is racing and pain and total body tremor since taking CBD gummies.   ?  ? ?Home Medications ?Prior to Admission medications   ?Medication Sig Start Date End Date Taking? Authorizing Provider  ?amLODipine (NORVASC) 5 MG tablet Take 5 mg by mouth daily. 02/24/20   [provider]  ?aspirin 81 MG chewable tablet Chew 81 mg by mouth daily.    [provider]  ?baclofen (LIORESAL) 10 MG tablet Take 10 mg by mouth 2 (two) times daily. 08/22/20   [provider]  ?Coenzyme Q10 (COQ-10 PO) Take 1 capsule by mouth daily at 12 noon.    [provider]  ?metFORMIN (GLUCOPHAGE-XR) 500 MG 24 hr tablet Take 500 mg by mouth 2 (two) times daily. 01/29/21   [provider]  ?metoprolol succinate (TOPROL-XL) 25 MG 24 hr tablet TAKE 1 TABLET (25 MG TOTAL) BY MOUTH DAILY. ?Patient taking  differently: Take 25 mg by mouth as needed. 10/31/20 03/07/21  Arthur Sanders, [name], DO  ?Multiple Vitamin (MULTIVITAMIN WITH MINERALS) TABS tablet Take 1 tablet by mouth daily.    [provider]  ?nitroGLYCERIN (NITROSTAT) 0.4 MG SL tablet PLACE 1 TABLET UNDER THE TONGUE EVERY 5 MINUTES AS NEEDED CHEST PAIN. IF YOU REQUIRE MORE THAN 2 TABLETS 5 MINUTES APART GO TO ER. 08/22/20   Arthur Sanders, [name], DO  ?Omega-3 Fatty Acids (FISH OIL PO) Take 1,600 mg by mouth daily.    [provider]  ?omeprazole (PRILOSEC) 40 MG capsule Take 40 mg by mouth as needed. 01/26/20   [provider]  ?ONGLYZA 2.5 MG TABS tablet Take 2.5 mg by mouth daily. 03/06/21   [provider]  ?rosuvastatin (CRESTOR) 20 MG tablet Take 1 tablet (20 mg total) by mouth daily. 10/28/20 10/23/21  [name], [name], [name]  ?triamterene-hydrochlorothiazide (MAXZIDE-25) 37.5-25 MG tablet Take 0.5 tablets by mouth daily. 12/16/19   [provider]  ?   ? ?Allergies    ?Egg [eggs or egg-derived products]   ? ?Review of Systems   ?Review of Systems  ?Constitutional:  Negative for fever.  ?HENT:  Negative for congestion.   ?Eyes:  Negative for redness.  ?Respiratory:  Negative for cough and shortness of breath.   ?Cardiovascular:  Positive for chest pain. Negative for claudication, leg swelling, syncope and PND.  ?Gastrointestinal:  Negative for heartburn and  vomiting.  ?Musculoskeletal:  Negative for back pain.  ?Neurological:  Positive for tremors. Negative for seizures, facial asymmetry, speech difficulty, weakness and numbness.  ?All other systems reviewed and are negative. ? ?Physical Exam ?Updated Vital Signs ?BP 126/75   Pulse 77   Resp 17   Ht 6\' 1"  (1.854 m)   Wt 88 kg   SpO2 95%   BMI 25.60 kg/m?  ?Physical Exam ?Vitals and nursing note reviewed. Exam conducted with a chaperone present.  ?Constitutional:   ?   General: He is not in acute distress. ?   Appearance: He is not diaphoretic.  ?HENT:  ?   Head:  Normocephalic and atraumatic.  ?   Nose: Nose normal.  ?Eyes:  ?   Conjunctiva/sclera: Conjunctivae normal.  ?   Pupils: Pupils are equal, round, and reactive to light.  ?Cardiovascular:  ?   Rate and Rhythm: Normal rate and regular rhythm.  ?   Pulses: Normal pulses.  ?   Heart sounds: Normal heart sounds.  ?Pulmonary:  ?   Effort: Pulmonary effort is normal.  ?   Breath sounds: Normal breath sounds.  ?Abdominal:  ?   General: Bowel sounds are normal.  ?   Palpations: Abdomen is soft.  ?   Tenderness: There is no abdominal tenderness. There is no guarding.  ?Musculoskeletal:     ?   General: Normal range of motion.  ?   Cervical back: Normal range of motion and neck supple.  ?   Right lower leg: No edema.  ?   Left lower leg: No edema.  ?Skin: ?   General: Skin is warm and dry.  ?   Capillary Refill: Capillary refill takes less than 2 seconds.  ?Neurological:  ?   General: No focal deficit present.  ?   Mental Status: He is oriented to person, place, and time.  ?   Deep Tendon Reflexes: Reflexes normal.  ?   Comments: Tremors   ?Psychiatric:     ?   Mood and Affect: Mood normal.     ?   Behavior: Behavior normal.  ? ? ?ED Results / Procedures / Treatments   ?Labs ?(all labs ordered are listed, but only abnormal results are displayed) ?Results for orders placed or performed during the hospital encounter of 05/11/21  ?CBC with Differential  ?Result Value Ref Range  ? WBC 8.2 4.0 - 10.5 K/uL  ? RBC 4.66 4.22 - 5.81 MIL/uL  ? Hemoglobin 13.0 13.0 - 17.0 g/dL  ? HCT 39.9 39.0 - 52.0 %  ? MCV 85.6 80.0 - 100.0 fL  ? MCH 27.9 26.0 - 34.0 pg  ? MCHC 32.6 30.0 - 36.0 g/dL  ? RDW 12.3 11.5 - 15.5 %  ? Platelets 251 150 - 400 K/uL  ? nRBC 0.0 0.0 - 0.2 %  ? Neutrophils Relative % 40 %  ? Neutro Abs 3.3 1.7 - 7.7 K/uL  ? Lymphocytes Relative 40 %  ? Lymphs Abs 3.3 0.7 - 4.0 K/uL  ? Monocytes Relative 14 %  ? Monocytes Absolute 1.1 (H) 0.1 - 1.0 K/uL  ? Eosinophils Relative 4 %  ? Eosinophils Absolute 0.3 0.0 - 0.5 K/uL  ?  Basophils Relative 1 %  ? Basophils Absolute 0.1 0.0 - 0.1 K/uL  ? Immature Granulocytes 1 %  ? Abs Immature Granulocytes 0.05 0.00 - 0.07 K/uL  ?Basic metabolic panel  ?Result Value Ref Range  ? Sodium 133 (L) 135 - 145 mmol/L  ?  Potassium 3.5 3.5 - 5.1 mmol/L  ? Chloride 96 (L) 98 - 111 mmol/L  ? CO2 24 22 - 32 mmol/L  ? Glucose, Bld 156 (H) 70 - 99 mg/dL  ? BUN 18 6 - 20 mg/dL  ? Creatinine, Ser 1.54 (H) 0.61 - 1.24 mg/dL  ? Calcium 9.3 8.9 - 10.3 mg/dL  ? GFR, Estimated 52 (L) >60 mL/min  ? Anion gap 13 5 - 15  ?Acetaminophen level  ?Result Value Ref Range  ? Acetaminophen (Tylenol), Serum <10 (L) 10 - 30 ug/mL  ?Salicylate level  ?Result Value Ref Range  ? Salicylate Lvl <7.0 (L) 7.0 - 30.0 mg/dL  ?Troponin I (High Sensitivity)  ?Result Value Ref Range  ? Troponin I (High Sensitivity) 4 <18 ng/L  ? ?DG Chest Portable 1 View ? ?Result Date: 05/11/2021 ?CLINICAL DATA:  Chest pain EXAM: PORTABLE CHEST 1 VIEW COMPARISON:  01/16/2020 FINDINGS: Heart and mediastinal contours are within normal limits. No focal opacities or effusions. No acute bony abnormality. IMPRESSION: No active disease. Electronically Signed   By: Charlett Nose M.D.   On: 05/11/2021 02:41   ? ? ? ?EKG ? EKG Interpretation ? ?Date/Time:  Thursday Dahmir Epperly 27 2023 02:55:46 EDT ?Ventricular Rate:  76 ?PR Interval:  168 ?QRS Duration: 96 ?QT Interval:  398 ?QTC Calculation: 448 ?R Axis:   46 ?Text Interpretation: Sinus rhythm Borderline T abnormalities, inferior leads , unchanged QT normal Confirmed by Nicanor Alcon, Desiderio Dolata ([id number]) on 05/11/2021 4:13:20 AM ?  ? ?  ?First EKG had prolonged QT, thought to medication effect, bicarbonate 1 amp was given with normalization.   ? ?Radiology ?DG Chest Portable 1 View ? ?Result Date: 05/11/2021 ?CLINICAL DATA:  Chest pain EXAM: PORTABLE CHEST 1 VIEW COMPARISON:  01/16/2020 FINDINGS: Heart and mediastinal contours are within normal limits. No focal opacities or effusions. No acute bony abnormality. IMPRESSION: No active  disease. Electronically Signed   By: Charlett Nose M.D.   On: 05/11/2021 02:41   ? ?Procedures ?Procedures  ? ? ?Medications Ordered in ED ?Medications  ?acetaminophen (TYLENOL) tablet 1,000 mg (has no administration in time range)  ?

## 2021-05-11 NOTE — ED Notes (Signed)
Pt given a urinal and reminded a urine sample Is needed. Pt states he still does not have to urinate. Dr. Nicanor Alcon informed. Second bolus of 500cc NS started. ?

## 2021-05-11 NOTE — ED Notes (Signed)
Pt informed a urine sample is needed. 

## 2021-05-11 NOTE — ED Triage Notes (Signed)
Patient arrived via POV c/o chest pain x 20 minutes before awakening. Patient states chest pain is left chest radiating to back. Patient is AO x 4, VS WDL, wheelchaired in. ?

## 2021-05-11 NOTE — ED Notes (Addendum)
Dr. Nicanor Alcon explained the risks of taking CBD gummies to the patient and his wife and she also printed out an article from [facility] stating this information and gave it to the patient and his wife. ?

## 2021-05-11 NOTE — ED Notes (Signed)
Dr. Palumbo at bedside. 

## 2021-08-03 ENCOUNTER — Encounter: Payer: Self-pay | Admitting: Cardiology

## 2021-08-03 ENCOUNTER — Ambulatory Visit: Payer: 59 | Admitting: Cardiology

## 2021-08-03 VITALS — BP 130/84 | HR 72 | Temp 98.0°F | Resp 16 | Ht 73.0 in | Wt 185.0 lb

## 2021-08-03 DIAGNOSIS — E782 Mixed hyperlipidemia: Secondary | ICD-10-CM

## 2021-08-03 DIAGNOSIS — R072 Precordial pain: Secondary | ICD-10-CM

## 2021-08-03 DIAGNOSIS — I10 Essential (primary) hypertension: Secondary | ICD-10-CM

## 2021-08-03 DIAGNOSIS — I2510 Atherosclerotic heart disease of native coronary artery without angina pectoris: Secondary | ICD-10-CM

## 2021-08-03 DIAGNOSIS — E119 Type 2 diabetes mellitus without complications: Secondary | ICD-10-CM

## 2021-08-03 NOTE — Progress Notes (Addendum)
Date:  08/03/2021   ID:  Arthur Sanders, DOB [birth year], MRN [id number]  PCP:  Rogers Blocker, MD  Cardiologist:  Rex Kras, DO, [facility] (established care 01/20/2020)  Date: 08/03/21 Last Office Visit: 03/07/2021  Chief Complaint  Patient presents with   Follow-up    47-month follow-up for nonobstructive coronary artery disease    HPI  Arthur Sanders is a 61 y.o. male whose past medical history and cardiovascular risk factors include: Moderate coronary artery disease with calcified coronary atherosclerosis, Hypertension, hyperlipidemia, NIDDM Type II, GERD.  Patient was referred to the practice for evaluation of chest pain and has undergone an ischemic evaluation which revealed nonobstructive CAD.  Since the last 6 months patient states that he is doing well however recently has started experiencing precordial pain.  Located over the left anterior and mid axillary line, states that his pec muscles are asymmetric when comparing right and left, chest discomfort usually present at night when he lays in a supine position, nonradiating, 4 out of 10, better with moving around or sleeping on his stomach.  Associated symptoms include numbness in the left arm.  FUNCTIONAL STATUS: Exercises three days a week (sit up, push up, planking).    ALLERGIES: Allergies  Allergen Reactions   Egg [Eggs Or Egg-Derived Products]     MEDICATION LIST PRIOR TO VISIT: Current Meds  Medication Sig   amLODipine (NORVASC) 5 MG tablet Take 5 mg by mouth daily.   aspirin 81 MG chewable tablet Chew 81 mg by mouth daily.   baclofen (LIORESAL) 10 MG tablet Take 10 mg by mouth 2 (two) times daily.   Coenzyme Q10 (COQ-10 PO) Take 1 capsule by mouth daily at 12 noon.   metFORMIN (GLUCOPHAGE-XR) 500 MG 24 hr tablet Take 500 mg by mouth 2 (two) times daily.   nitroGLYCERIN (NITROSTAT) 0.4 MG SL tablet PLACE 1 TABLET UNDER THE TONGUE EVERY 5 MINUTES AS NEEDED CHEST PAIN. IF YOU REQUIRE MORE THAN 2 TABLETS 5 MINUTES  APART GO TO ER.   Omega-3 Fatty Acids (FISH OIL PO) Take 1,600 mg by mouth daily.   omeprazole (PRILOSEC) 40 MG capsule Take 40 mg by mouth as needed.   ONGLYZA 2.5 MG TABS tablet Take 2.5 mg by mouth daily.   rosuvastatin (CRESTOR) 20 MG tablet Take 1 tablet (20 mg total) by mouth daily.   triamterene-hydrochlorothiazide (MAXZIDE-25) 37.5-25 MG tablet Take 0.5 tablets by mouth daily.     PAST MEDICAL HISTORY: Past Medical History:  Diagnosis Date   Allergy    Coronary artery disease    Diabetes mellitus without complication ([place])    Hyperlipidemia    Hypertension     PAST SURGICAL HISTORY: Past Surgical History:  Procedure Laterality Date   INTRAVASCULAR PRESSURE WIRE/FFR STUDY N/A 02/16/2020   Procedure: INTRAVASCULAR PRESSURE WIRE/FFR STUDY;  Surgeon: Adrian Prows, MD;  Location: [facility];  Service: Cardiovascular;  Laterality: N/A;   LEFT HEART CATH AND CORONARY ANGIOGRAPHY N/A 02/16/2020   Procedure: LEFT HEART CATH AND CORONARY ANGIOGRAPHY;  Surgeon: Adrian Prows, MD;  Location: [facility];  Service: Cardiovascular;  Laterality: N/A;     FAMILY HISTORY: The patient family history includes Cancer (age of onset: 57) in his father; Diabetes in his father, mother, and sister; Hyperlipidemia in his sister; Hypertension in his father and mother; Stroke in his sister.  SOCIAL HISTORY:  The patient  reports that he has never smoked. He has never used smokeless tobacco. He reports current alcohol  use. He reports that he does not use drugs.  REVIEW OF SYSTEMS: Review of Systems  Cardiovascular:  Negative for chest pain, cyanosis, dyspnea on exertion, leg swelling, orthopnea, palpitations and syncope.  Respiratory:  Negative for shortness of breath.     PHYSICAL EXAM:    08/03/2021    2:50 PM 05/11/2021    6:00 AM 05/11/2021    4:30 AM  Vitals with BMI  Height 6\' 1"     Weight 185 lbs    BMI 24.41    Systolic 130 117  Diastolic 84 84 81  Pulse 72 66 64     CONSTITUTIONAL: Well-developed and well-nourished. No acute distress.  SKIN: Skin is warm and dry. No rash noted. No cyanosis. No pallor. No jaundice HEAD: Normocephalic and atraumatic.  EYES: No scleral icterus MOUTH/THROAT: Moist oral membranes.  NECK: No JVD present. No thyromegaly noted. No carotid bruits  CHEST: No significant asymmetry between the right and left pectoral region, no palpable nodule over the left pectoral region.  Normal respiratory effort. No intercostal retractions  LUNGS: Clear to auscultation bilaterally.  No stridor. No wheezes. No rales.  CARDIOVASCULAR: Regular rate and rhythm, positive S1-S2, no murmurs rubs or gallops appreciated ABDOMINAL: No apparent ascites.  EXTREMITIES: No peripheral edema, 2+ dorsalis pedis and posterior tibial pulses, warm to touch. HEMATOLOGIC: No significant bruising NEUROLOGIC: Oriented to person, place, and time. Nonfocal. Normal muscle tone.  PSYCHIATRIC: Normal mood and affect. Normal behavior. Cooperative  CARDIAC DATABASE: EKG: 08/03/2021: NSR, 63 bpm, Nonspecific T wave abnormality, no significant change compared to prior EKG.  Echocardiogram: 01/28/2020:  Normal LV systolic function with visual EF 60-65%.  Left ventricle cavity is normal in size. Normal global wall motion. Normal diastolic filling pattern, normal LAP.  Mild tricuspid regurgitation. No evidence of pulmonary hypertension.  No other significant valvular heart disease.  No prior study for comparison.   Stress Testing: No results found for this or any previous visit from the past 1095 days.  Coronary CTA: 01/26/2020  1. Coronary calcium score of 371. This was 96th percentile for age and sex matched control. 2. Normal coronary origin with right dominance. 3. CAD-RADS = 3. Moderate stenosis within proximal LAD, Moderate stenosis within proximal LCx. Mild stenosis within proximal RCA. 4. Study will not be sent for CT-FFR due to artifact from frequent PVCs  and misregistration. Consider invasive coronary angiography, if clinically indicated. Non-cardiac portion: Negative over-read examination  Heart Catheterization: Left Heart Catheterization and DFR to LAD and RCA 02/16/20:  LV: Normal LV systolic function, EDP 6 mmHg.  No pressure gradient across the aortic valve.  LVEF 55%. Left main: Large vessel.  No significant disease. LAD: Large vessel.  Proximal segment has a eccentric 50% stenosis.  There are several small to moderate-sized diagonals.  The stenosis is irregular.  No ulceration noted. Circumflex: Large caliber vessel.  Gives origin to 2 large OM branches.  There is mild disease in the midsegment constituting 20 to 30% stenosis. RCA: Dominant vessel.  Proximal segment has a small eccentric 60% stenosis.   DFR to proximal LAD = 0.92 and proximal RCA 0.95, not hemodynamically significant.   Recommendation: Aggressive risk factor modification is indicated.  Some of this symptoms of angina could be related to coronary spasm and could consider discontinuing diuretics and switching to amlodipine.  70 mL contrast utilized.  LABORATORY DATA:    Latest Ref Rng & Units 05/11/2021    2:30 AM 02/16/2020    9:50 AM 02/16/2020  9:32 AM  CBC  WBC 4.0 - 10.5 K/uL 8.2   5.1   Hemoglobin 13.0 - 17.0 g/dL 13.0  14.6  13.8   Hematocrit 39.0 - 52.0 % 39.9  43.0  43.1   Platelets 150 - 400 K/uL 251   217        Latest Ref Rng & Units 05/11/2021    2:30 AM 02/16/2020    9:50 AM 02/16/2020    9:32 AM  CMP  Glucose 70 - 99 mg/dL 156  178  186   BUN 6 - 20 mg/dL $Remove'18  17  14   'JmdNTdd$ Creatinine 0.61 - 1.24 mg/dL 1.54  1.50  1.56   Sodium 135 - 145 mmol/L 133  135  135   Potassium 3.5 - 5.1 mmol/L 3.5  3.7  3.5   Chloride 98 - 111 mmol/L 96  93  95   CO2 22 - 32 mmol/L 24   27   Calcium 8.9 - 10.3 mg/dL 9.3   10.0     No components found for: "NTPROBNP" No results for input(s): "PROBNP" in the last 8760 hours. No results for input(s): "TSH" in the last 8760  hours.  BMP Recent Labs    05/11/21 0230  NA 133*  K 3.5  CL 96*  CO2 24  GLUCOSE 156*  BUN 18  CREATININE 1.54*  CALCIUM 9.3  GFRNONAA 52*     HEMOGLOBIN A1C No results found for: "HGBA1C", "MPG"  External Labs: Collected: 12/04/2019, outside labs received from PCP Creatinine 1.42 mg/dL. eGFR: 63 mL/min per 1.73 m Potassium 4.2 Lipid profile: Total cholesterol 153, triglycerides 97, HDL 41, LDL 92, non-HDL 111 AST 20, ALT 18 Hemoglobin A1c: 8.5  IMPRESSION:    ICD-10-CM   1. Precordial pain  R07.2 EKG 12-Lead    2. Nonobstructive atherosclerosis of coronary artery  I25.10     3. Total CAC 371 AU, 96th percentile  I25.10    I25.84     4. Non-insulin dependent type 2 diabetes mellitus ([place])  E11.9     5. Benign hypertension  I10     6. Mixed hyperlipidemia  E78.2        RECOMMENDATIONS:  Arthur Sanders is a 61 y.o. male whose past medical history and cardiac risk factors include: Moderate coronary artery disease with calcified coronary atherosclerosis, Hypertension, hyperlipidemia, NIDDM Type II, GERD.  Precordial pain: Symptoms suggestive of noncardiac discomfort. No significant asymmetry between the right and left pectoral region.  And no obvious induration on physical examination over the left pectoral region which the patient states thinks is asymmetric. I have asked the patient to discuss the symptoms with PCP and consider noncardiac work-up / further imaging. EKG illustrates sinus rhythm without injury pattern. Reviewed the most recent echo, coronary CTA and left heart catheterization reports. Hold off on additional cardiovascular testing at this time; however, if the symptoms do increase in intensity, frequency, or duration would recommend the patient go to the closest ER via EMS for further evaluation and management.  Nonobstructive atherosclerosis of coronary artery /moderate CAC Chronic and stable. Total CAC 371, 96 percentile. No use of  sublingual nitroglycerin tablets since last visit Medications reconciled. EKG: Normal sinus without underlying injury pattern. Educated importance of improving his modifiable cardiovascular risk factors. Continue aspirin and statin therapy.  Non-insulin dependent type 2 diabetes mellitus ([place]) Educated him on the importance of glycemic control. Currently on metformin, statin therapy. Recommend discussing with PCP with regards to transitioning some BP meds  to either ACE inhibitor's or ARB given her history of diabetes.   Benign hypertension Office blood pressures are very well controlled. Medications reconciled.  Mixed hyperlipidemia Currently on rosuvastatin.   He denies myalgia or other side effects. Recommend a goal LDL of <70 mg/dL. Currently managed by primary care provider.   FINAL MEDICATION LIST END OF ENCOUNTER: No orders of the defined types were placed in this encounter.    Current Outpatient Medications:    amLODipine (NORVASC) 5 MG tablet, Take 5 mg by mouth daily., Disp: , Rfl:    aspirin 81 MG chewable tablet, Chew 81 mg by mouth daily., Disp: , Rfl:    baclofen (LIORESAL) 10 MG tablet, Take 10 mg by mouth 2 (two) times daily., Disp: , Rfl:    Coenzyme Q10 (COQ-10 PO), Take 1 capsule by mouth daily at 12 noon., Disp: , Rfl:    metFORMIN (GLUCOPHAGE-XR) 500 MG 24 hr tablet, Take 500 mg by mouth 2 (two) times daily., Disp: , Rfl:    nitroGLYCERIN (NITROSTAT) 0.4 MG SL tablet, PLACE 1 TABLET UNDER THE TONGUE EVERY 5 MINUTES AS NEEDED CHEST PAIN. IF YOU REQUIRE MORE THAN 2 TABLETS 5 MINUTES APART GO TO ER., Disp: 25 tablet, Rfl: 0   Omega-3 Fatty Acids (FISH OIL PO), Take 1,600 mg by mouth daily., Disp: , Rfl:    omeprazole (PRILOSEC) 40 MG capsule, Take 40 mg by mouth as needed., Disp: , Rfl:    ONGLYZA 2.5 MG TABS tablet, Take 2.5 mg by mouth daily., Disp: , Rfl:    rosuvastatin (CRESTOR) 20 MG tablet, Take 1 tablet (20 mg total) by mouth daily., Disp: 90 tablet, Rfl:  3   triamterene-hydrochlorothiazide (MAXZIDE-25) 37.5-25 MG tablet, Take 0.5 tablets by mouth daily., Disp: , Rfl:    metoprolol succinate (TOPROL-XL) 25 MG 24 hr tablet, TAKE 1 TABLET (25 MG TOTAL) BY MOUTH DAILY. (Patient taking differently: Take 25 mg by mouth as needed.), Disp: 30 tablet, Rfl: 0  Orders Placed This Encounter  Procedures   EKG 12-Lead    There are no Patient Instructions on file for this visit.   --Continue cardiac medications as reconciled in final medication list. --Return in about 6 months (around 02/03/2022) for Follow up, CAD. Or sooner if needed. --Continue follow-up with your primary care physician regarding the management of your other chronic comorbid conditions.  Patient's questions and concerns were addressed to his satisfaction. He voices understanding of the instructions provided during this encounter.   This note was created using a voice recognition software as a result there may be grammatical errors inadvertently enclosed that do not reflect the nature of this encounter. Every attempt is made to correct such errors.  Mechele Claude [facility]  Pager: [phone] Office: [phone]  ADDENDUM External Labs: Collected: February 14, 2021 provided by Dr. Randel Pigg office. Total cholesterol 123, HDL 45, triglycerides 63, LDL 64. BUN 17, creatinine 1.36 mg/dL. BUN to creatinine ratio 13. Sodium 135, potassium 4.1, chloride 96, bicarb 29. AST 19, ALT 11, alkaline phosphatase 36. A1c 8.0. Hemoglobin 12.5 g/dL, hematocrit 38%  Mechele Claude [facility]  Pager: [phone] Office: [phone]

## 2022-02-06 ENCOUNTER — Encounter: Payer: Self-pay | Admitting: Cardiology

## 2022-02-06 ENCOUNTER — Ambulatory Visit: Payer: 59 | Admitting: Cardiology

## 2022-02-06 VITALS — BP 119/81 | HR 72 | Ht 73.0 in | Wt 180.4 lb

## 2022-02-06 DIAGNOSIS — I10 Essential (primary) hypertension: Secondary | ICD-10-CM

## 2022-02-06 DIAGNOSIS — E782 Mixed hyperlipidemia: Secondary | ICD-10-CM

## 2022-02-06 DIAGNOSIS — E119 Type 2 diabetes mellitus without complications: Secondary | ICD-10-CM

## 2022-02-06 DIAGNOSIS — I2510 Atherosclerotic heart disease of native coronary artery without angina pectoris: Secondary | ICD-10-CM

## 2022-02-06 MED ORDER — METOPROLOL SUCCINATE ER 25 MG PO TB24: 12.5000 mg | ORAL_TABLET | Freq: Every morning | ORAL | 0 refills | Status: AC

## 2022-02-06 NOTE — Progress Notes (Signed)
Date:  02/06/2022   ID:  BHARATH PEEK, DOB [birth year], MRN [id number]  PCP:  Rogers Blocker, MD  Cardiologist:  Rex Kras, DO, [facility] (established care 01/20/2020)  Date: 02/06/22 Last Office Visit: 08/03/2021  Chief Complaint  Patient presents with   Follow-up    6 month  CAD     HPI  Arthur Sanders is a 62 y.o. male whose past medical history and cardiovascular risk factors include: Moderate coronary artery disease with calcified coronary atherosclerosis, Hypertension, hyperlipidemia, NIDDM Type II, GERD.  Prior ischemic workup noted nonobstructive CAD.  He presents today for 59-month follow-up visit.  Since last office visit he has not had any anginal discomfort or heart failure symptoms.  His overall functional capacity remains stable.  His last blood work was in November 2023 with PCP which is not available in [place] or epic.  Will request records.  FUNCTIONAL STATUS: Exercises three days a week (sit up, push up, planking).    ALLERGIES: Allergies  Allergen Reactions   Egg [Eggs Or Egg-Derived Products]     MEDICATION LIST PRIOR TO VISIT: Current Meds  Medication Sig   amLODipine (NORVASC) 5 MG tablet Take 5 mg by mouth daily.   aspirin 81 MG chewable tablet Chew 81 mg by mouth daily.   Coenzyme Q10 (COQ-10 PO) Take 1 capsule by mouth daily at 12 noon.   JARDIANCE 10 MG TABS tablet Take 10 mg by mouth daily.   metFORMIN (GLUCOPHAGE-XR) 500 MG 24 hr tablet Take 500 mg by mouth 2 (two) times daily.   nitroGLYCERIN (NITROSTAT) 0.4 MG SL tablet PLACE 1 TABLET UNDER THE TONGUE EVERY 5 MINUTES AS NEEDED CHEST PAIN. IF YOU REQUIRE MORE THAN 2 TABLETS 5 MINUTES APART GO TO ER.   Omega-3 Fatty Acids (FISH OIL PO) Take 1,600 mg by mouth daily.   omeprazole (PRILOSEC) 40 MG capsule Take 40 mg by mouth as needed.   rosuvastatin (CRESTOR) 20 MG tablet Take 1 tablet (20 mg total) by mouth daily.   [DISCONTINUED] metoprolol succinate (TOPROL-XL) 25 MG 24 hr tablet  TAKE 1 TABLET (25 MG TOTAL) BY MOUTH DAILY. (Patient taking differently: Take 25 mg by mouth as needed.)     PAST MEDICAL HISTORY: Past Medical History:  Diagnosis Date   Allergy    Coronary artery disease    Diabetes mellitus without complication ([place])    Hyperlipidemia    Hypertension     PAST SURGICAL HISTORY: Past Surgical History:  Procedure Laterality Date   INTRAVASCULAR PRESSURE WIRE/FFR STUDY N/A 02/16/2020   Procedure: INTRAVASCULAR PRESSURE WIRE/FFR STUDY;  Surgeon: Adrian Prows, MD;  Location: [place] [facility];  Service: Cardiovascular;  Laterality: N/A;   LEFT HEART CATH AND CORONARY ANGIOGRAPHY N/A 02/16/2020   Procedure: LEFT HEART CATH AND CORONARY ANGIOGRAPHY;  Surgeon: Adrian Prows, MD;  Location: [place] [facility];  Service: Cardiovascular;  Laterality: N/A;     FAMILY HISTORY: The patient family history includes Cancer (age of onset: 24) in his father; Diabetes in his father, mother, and sister; Hyperlipidemia in his sister; Hypertension in his father and mother; Stroke in his sister.  SOCIAL HISTORY:  The patient  reports that he has never smoked. He has never used smokeless tobacco. He reports current alcohol use. He reports that he does not use drugs.  REVIEW OF SYSTEMS: Review of Systems  Cardiovascular:  Negative for chest pain, claudication, dyspnea on exertion, irregular heartbeat, leg swelling, near-syncope, orthopnea, palpitations, paroxysmal nocturnal dyspnea and syncope.  Respiratory:  Negative for shortness of breath.   Hematologic/Lymphatic: Negative for bleeding problem.  Musculoskeletal:  Negative for muscle cramps and myalgias.  Neurological:  Negative for dizziness and light-headedness.    PHYSICAL EXAM:    02/06/2022    8:57 AM 08/03/2021    2:50 PM 05/11/2021    6:00 AM  Vitals with BMI  Height 6\' 1"  6\' 1"    Weight 180 lbs 6 oz 185 lbs   BMI 10.93 23.55   Systolic 732 202 542  Diastolic 81 84 84  Pulse 72 72 66   Physical Exam   Constitutional: No distress.  Age appropriate, hemodynamically stable.   Neck: No JVD present.  Cardiovascular: Normal rate, regular rhythm, S1 normal, S2 normal, intact distal pulses and normal pulses. Exam reveals no gallop, no S3 and no S4.  No murmur heard. Pulses:      Dorsalis pedis pulses are 2+ on the right side and 2+ on the left side.       Posterior tibial pulses are 2+ on the right side and 2+ on the left side.  Pulmonary/Chest: Effort normal and breath sounds normal. No stridor. He has no wheezes. He has no rales.  Abdominal: Soft. Bowel sounds are normal. He exhibits no distension. There is no abdominal tenderness.  Musculoskeletal:        General: No edema.     Cervical back: Neck supple.  Neurological: He is alert and oriented to person, place, and time. He has intact cranial nerves (2-12).  Skin: Skin is warm and moist.   CARDIAC DATABASE: EKG: 02/06/2022: Sinus rhythm, 61 bpm, occasional PACs, TWI in inferolateral leads consider ischemia.  No significant change compared to prior EKG 08/03/2021.  Echocardiogram: 01/28/2020:  Normal LV systolic function with visual EF 60-65%.  Left ventricle cavity is normal in size. Normal global wall motion. Normal diastolic filling pattern, normal LAP.  Mild tricuspid regurgitation. No evidence of pulmonary hypertension.  No other significant valvular heart disease.  No prior study for comparison.   Stress Testing: No results found for this or any previous visit from the past 1095 days.  Coronary CTA: 01/26/2020  1. Coronary calcium score of 371. This was 96th percentile for age and sex matched control. 2. Normal coronary origin with right dominance. 3. CAD-RADS = 3. Moderate stenosis within proximal LAD, Moderate stenosis within proximal LCx. Mild stenosis within proximal RCA. 4. Study will not be sent for CT-FFR due to artifact from frequent PVCs and misregistration. Consider invasive coronary angiography, if clinically  indicated. Non-cardiac portion: Negative over-read examination  Heart Catheterization: Left Heart Catheterization and DFR to LAD and RCA 02/16/20:  LV: Normal LV systolic function, EDP 6 mmHg.  No pressure gradient across the aortic valve.  LVEF 55%. Left main: Large vessel.  No significant disease. LAD: Large vessel.  Proximal segment has a eccentric 50% stenosis.  There are several small to moderate-sized diagonals.  The stenosis is irregular.  No ulceration noted. Circumflex: Large caliber vessel.  Gives origin to 2 large OM branches.  There is mild disease in the midsegment constituting 20 to 30% stenosis. RCA: Dominant vessel.  Proximal segment has a small eccentric 60% stenosis.   DFR to proximal LAD = 0.92 and proximal RCA 0.95, not hemodynamically significant.   Recommendation: Aggressive risk factor modification is indicated.  Some of this symptoms of angina could be related to coronary spasm and could consider discontinuing diuretics and switching to amlodipine.  70 mL contrast utilized.  LABORATORY DATA:  Latest Ref Rng & Units 05/11/2021    2:30 AM 02/16/2020    9:50 AM 02/16/2020    9:32 AM  CBC  WBC 4.0 - 10.5 K/uL 8.2   5.1   Hemoglobin 13.0 - 17.0 g/dL 13.0  14.6  13.8   Hematocrit 39.0 - 52.0 % 39.9  43.0  43.1   Platelets 150 - 400 K/uL 251   217        Latest Ref Rng & Units 05/11/2021    2:30 AM 02/16/2020    9:50 AM 02/16/2020    9:32 AM  CMP  Glucose 70 - 99 mg/dL 156  178  186   BUN 6 - 20 mg/dL 18  17  14    Creatinine 0.61 - 1.24 mg/dL 1.54  1.50  1.56   Sodium 135 - 145 mmol/L 133  135  135   Potassium 3.5 - 5.1 mmol/L 3.5  3.7  3.5   Chloride 98 - 111 mmol/L 96  93  95   CO2 22 - 32 mmol/L 24   27   Calcium 8.9 - 10.3 mg/dL 9.3   10.0     No components found for: "NTPROBNP" No results for input(s): "PROBNP" in the last 8760 hours. No results for input(s): "TSH" in the last 8760 hours.  BMP Recent Labs    05/11/21 0230  NA 133*  K 3.5  CL 96*   CO2 24  GLUCOSE 156*  BUN 18  CREATININE 1.54*  CALCIUM 9.3  GFRNONAA 52*     HEMOGLOBIN A1C No results found for: "HGBA1C", "MPG"  External Labs: Collected: 12/04/2019, outside labs received from PCP Creatinine 1.42 mg/dL. eGFR: 63 mL/min per 1.73 m Potassium 4.2 Lipid profile: Total cholesterol 153, triglycerides 97, HDL 41, LDL 92, non-HDL 111 AST 20, ALT 18 Hemoglobin A1c: 8.5  External Labs: Collected: February 14, 2021 provided by Dr. Randel Pigg office. Total cholesterol 123, HDL 45, triglycerides 63, LDL 64. BUN 17, creatinine 1.36 mg/dL. BUN to creatinine ratio 13. Sodium 135, potassium 4.1, chloride 96, bicarb 29. AST 19, ALT 11, alkaline phosphatase 36. A1c 8.0. Hemoglobin 12.5 g/dL, hematocrit 38%  IMPRESSION:    ICD-10-CM   1. Nonobstructive atherosclerosis of coronary artery  I25.10 EKG 12-Lead    metoprolol succinate (TOPROL-XL) 25 MG 24 hr tablet    2. Total CAC 371 AU, 96th percentile  I25.10    I25.84     3. Non-insulin dependent type 2 diabetes mellitus ([place])  E11.9     4. Benign hypertension  I10     5. Mixed hyperlipidemia  E78.2        RECOMMENDATIONS:  EGON DITTUS is a 62 y.o. male whose past medical history and cardiac risk factors include: Moderate coronary artery disease with calcified coronary atherosclerosis, Hypertension, hyperlipidemia, NIDDM Type II, GERD.  Nonobstructive atherosclerosis of coronary artery /moderate CAC Chronic and stable. Total CAC 371, 96 percentile. Medications reconciled. EKG: Normal sinus with TWI inferolateral leads without underlying injury pattern (similar to prior ECGs) Educated importance of improving his modifiable cardiovascular risk factors. Continue aspirin and statin therapy. No use of sublingual nitroglycerin tablets since the last office visit. No additional cardiovascular testing warranted at this time.  Non-insulin dependent type 2 diabetes mellitus ([place]) Educated him on the importance  of glycemic control. Currently on metformin, Jardiance, statin therapy. We recommended being on a low-dose ACE inhibitor or ARB for renal protection given his diabetes.  Benign hypertension Office blood pressures are very well controlled. Will  decrease Toprol-XL to 12.5 mg p.o. every morning. Since his underlying CAD has been stable he could technically come off of Toprol-XL if PCP plans to start ACE inhibitors or ARB (recommend low-dose for renal protection) Medications reconciled.  Mixed hyperlipidemia Currently on rosuvastatin.   He denies myalgia or other side effects. Recommend a goal LDL of <70 mg/dL. Currently managed by primary care provider.  Requested labs from November 2023 for reference.  FINAL MEDICATION LIST END OF ENCOUNTER: Meds ordered this encounter  Medications   metoprolol succinate (TOPROL-XL) 25 MG 24 hr tablet    Sig: Take 0.5 tablets (12.5 mg total) by mouth every morning.    Dispense:  15 tablet    Refill:  0     Current Outpatient Medications:    amLODipine (NORVASC) 5 MG tablet, Take 5 mg by mouth daily., Disp: , Rfl:    aspirin 81 MG chewable tablet, Chew 81 mg by mouth daily., Disp: , Rfl:    Coenzyme Q10 (COQ-10 PO), Take 1 capsule by mouth daily at 12 noon., Disp: , Rfl:    JARDIANCE 10 MG TABS tablet, Take 10 mg by mouth daily., Disp: , Rfl:    metFORMIN (GLUCOPHAGE-XR) 500 MG 24 hr tablet, Take 500 mg by mouth 2 (two) times daily., Disp: , Rfl:    nitroGLYCERIN (NITROSTAT) 0.4 MG SL tablet, PLACE 1 TABLET UNDER THE TONGUE EVERY 5 MINUTES AS NEEDED CHEST PAIN. IF YOU REQUIRE MORE THAN 2 TABLETS 5 MINUTES APART GO TO ER., Disp: 25 tablet, Rfl: 0   Omega-3 Fatty Acids (FISH OIL PO), Take 1,600 mg by mouth daily., Disp: , Rfl:    omeprazole (PRILOSEC) 40 MG capsule, Take 40 mg by mouth as needed., Disp: , Rfl:    rosuvastatin (CRESTOR) 20 MG tablet, Take 1 tablet (20 mg total) by mouth daily., Disp: 90 tablet, Rfl: 3   metoprolol succinate (TOPROL-XL)  25 MG 24 hr tablet, Take 0.5 tablets (12.5 mg total) by mouth every morning., Disp: 15 tablet, Rfl: 0  Orders Placed This Encounter  Procedures   EKG 12-Lead    There are no Patient Instructions on file for this visit.   --Continue cardiac medications as reconciled in final medication list. --Return in about 1 year (around 02/07/2023) for Follow up, CAD. Or sooner if needed. --Continue follow-up with your primary care physician regarding the management of your other chronic comorbid conditions.  Patient's questions and concerns were addressed to his satisfaction. He voices understanding of the instructions provided during this encounter.   This note was created using a voice recognition software as a result there may be grammatical errors inadvertently enclosed that do not reflect the nature of this encounter. Every attempt is made to correct such errors.  Tessa Lerner, [place], [facility]  Pager: [phone] Office: [phone]

## 2022-07-23 ENCOUNTER — Encounter (INDEPENDENT_AMBULATORY_CARE_PROVIDER_SITE_OTHER): Payer: Self-pay | Admitting: Otolaryngology

## 2022-07-23 ENCOUNTER — Ambulatory Visit (INDEPENDENT_AMBULATORY_CARE_PROVIDER_SITE_OTHER): Payer: 59 | Admitting: Otolaryngology

## 2022-07-23 VITALS — BP 130/83 | HR 69 | Ht 73.0 in | Wt 180.0 lb

## 2022-07-23 DIAGNOSIS — R0981 Nasal congestion: Secondary | ICD-10-CM

## 2022-07-23 DIAGNOSIS — H608X3 Other otitis externa, bilateral: Secondary | ICD-10-CM | POA: Diagnosis not present

## 2022-07-23 DIAGNOSIS — J3089 Other allergic rhinitis: Secondary | ICD-10-CM

## 2022-07-23 DIAGNOSIS — H6123 Impacted cerumen, bilateral: Secondary | ICD-10-CM | POA: Diagnosis not present

## 2022-07-23 NOTE — Patient Instructions (Signed)
-   use sweet oil for cerumen impaction - continue nasal spray and OTC antihistamine (Loratadine) for nasal congestion and allergies

## 2022-07-23 NOTE — Progress Notes (Signed)
ENT CONSULT:  Reason for Consult: concern for cerumen impaction    HPI: Arthur Sanders is an 62 y.o. male with hx of T2DM on Metformin/Jardiance, no insulin, here for sensation of ear fullness and concern for cerumen impaction. No pain, no drainage. No muffled hearing no vertigo, no prior ear surgeries.  He has a history of nasal congestion and seasonal allergies and takes Allegra as needed.  He takes omeprazole for reflux as needed.   Records Reviewed:  History of angina and coronary disease also history of syncope and chest pain based on recently updated problem list    Past Medical History:  Diagnosis Date   Allergy    Coronary artery disease    Diabetes mellitus without complication ([facility])    Hyperlipidemia    Hypertension     Past Surgical History:  Procedure Laterality Date   CORONARY PRESSURE/FFR STUDY N/A 02/16/2020   Procedure: INTRAVASCULAR PRESSURE WIRE/FFR STUDY;  Surgeon: Yates Decamp, MD;  Location: [facility];  Service: Cardiovascular;  Laterality: N/A;   LEFT HEART CATH AND CORONARY ANGIOGRAPHY N/A 02/16/2020   Procedure: LEFT HEART CATH AND CORONARY ANGIOGRAPHY;  Surgeon: Yates Decamp, MD;  Location: [facility];  Service: Cardiovascular;  Laterality: N/A;    Family History  Problem Relation Age of Onset   Diabetes Mother    Hypertension Mother    Diabetes Father    Hypertension Father    Cancer Father 40       " bone cancer"   Diabetes Sister    Hyperlipidemia Sister    Stroke Sister     Social History:  reports that he has never smoked. He has never used smokeless tobacco. He reports current alcohol use. He reports that he does not use drugs.  Allergies:  Allergies  Allergen Reactions   Egg [Egg-Derived Products]     Medications: I have reviewed the patient's current medications.  No results found for this or any previous visit (from the past 48 hour(s)).  No results found.  The PMH, PSH, Medications, Allergies, and SH were reviewed  and updated.  ROS: Constitutional: Negative for fever, weight loss and weight gain. Cardiovascular: Negative for chest pain and dyspnea on exertion. Respiratory: Is not experiencing shortness of breath at rest. Gastrointestinal: Negative for nausea and vomiting. Neurological: Negative for headaches. Psychiatric: The patient is not nervous/anxiou  Blood pressure 130/83, pulse 69, height 6\' 1"  (1.854 m), weight 180 lb (81.6 kg), SpO2 98 %.  PHYSICAL EXAM:  Exam: General: Well-developed, well-nourished Communication and Voice: Clear pitch and clarity Respiratory Respiratory effort: Equal inspiration and expiration without stridor Cardiovascular Peripheral Vascular: Warm extremities with equal color/perfusion Eyes: No nystagmus with equal extraocular motion bilaterally Neuro/Psych/Balance: Patient oriented to person, place, and time; Appropriate mood and affect; Gait is intact with no imbalance; Cranial nerves I-XII are intact Head and Face Inspection: Normocephalic and atraumatic without mass or lesion Palpation: Facial skeleton intact without bony stepoffs Salivary Glands: No mass or tenderness Facial Strength: Facial motility symmetric and full bilaterally ENT Pinna: External ear intact and fully developed External canal: Canal is patent with intact skin -evidence of bilateral cerumen impaction worse on the right side Tympanic Membrane: Clear and mobile External Nose: No scar or anatomic deformity Internal Nose: Septum intact and midline. No edema, polyp, or rhinorrhea Lips, Teeth, and gums: Mucosa and teeth intact and viable TMJ: No pain to palpation with full mobility Oral cavity/oropharynx: No erythema or exudate, no lesions present Neck Neck and Trachea:  Midline trachea without mass or lesion Thyroid: No mass or nodularity Lymphatics: No lymphadenopathy  Procedure: Procedure: Cerumen Removal, Bilateral (CPT [id number])  Diagnosis: cerumen impaction, bilateral    Informed consent: Timeout performed and informed consent was obtained.  Procedure: Operating microscope was employed to evaluate the ear(s).  Cerumen curette, speculum and suction were employed to clear the cerumen.   Findings: Normal appearing tympanic membrane on the left without perforations, and external canals are normal after removal of cerumen.No middle ear fluid bilaterally.   Complications: None. Patient tolerated well.    Studies Reviewed:none  Assessment/Plan: Encounter Diagnoses  Name Primary?   Environmental and seasonal allergies Yes   Bilateral impacted cerumen    Nasal congestion    Chronic eczematous otitis externa of both ears    - use sweet oil for itchy ears and to prevent cerumen impaction - continue nasal spray and OTC antihistamine (Loratadine) for nasal congestion and allergies - RTC as needed    Thank you for allowing me to participate in the care of this patient. Please do not hesitate to contact me with any questions or concerns.   Ashok Croon, MD Otolaryngology [facility] Phone: [phone] Fax: [phone]    07/23/2022, 12:04 PM

## 2022-09-03 ENCOUNTER — Ambulatory Visit: Payer: 59 | Admitting: Cardiology

## 2022-09-03 ENCOUNTER — Encounter: Payer: Self-pay | Admitting: Cardiology

## 2022-09-03 VITALS — BP 125/82 | HR 65 | Resp 16 | Ht 73.0 in | Wt 182.8 lb

## 2022-09-03 DIAGNOSIS — I2510 Atherosclerotic heart disease of native coronary artery without angina pectoris: Secondary | ICD-10-CM

## 2022-09-03 DIAGNOSIS — E119 Type 2 diabetes mellitus without complications: Secondary | ICD-10-CM

## 2022-09-03 DIAGNOSIS — R072 Precordial pain: Secondary | ICD-10-CM

## 2022-09-03 DIAGNOSIS — I10 Essential (primary) hypertension: Secondary | ICD-10-CM

## 2022-09-03 DIAGNOSIS — E782 Mixed hyperlipidemia: Secondary | ICD-10-CM

## 2022-09-03 NOTE — Progress Notes (Signed)
Date:  09/03/2022   ID:  Arthur Sanders, DOB [birth year], MRN [id number]  PCP:  Gwenyth Bender, MD  Cardiologist:  Tessa Lerner, DO, [facility] (established care 01/20/2020)  Date: 09/03/22 Last Office Visit: 02/06/2022  Chief Complaint  Patient presents with   Pain   Chest Pain    HPI  Arthur Sanders is a 62 y.o. male whose past medical history and cardiovascular risk factors include: Moderate coronary artery disease with calcified coronary atherosclerosis, Hypertension, hyperlipidemia, NIDDM Type II, GERD.  Patient has known history of nonobstructive CAD was scheduled to follow-up on annual basis in January 2025.  He presents today for sooner office visit due to precordial pain.  About a month ago he had a RSV infection which led to urinary tract infection was on antibiotics.  In the recent past he has been noticing precordial discomfort more along the dermatomal pattern on the left side.  Sharp-like sensation, no improving or worsening factors.  Not brought on by effort related activities, does not resolve with rest.  Pain is very similar to when he had his workup back in 2022 which included a coronary CTA and left heart catheterization.  The reason why his wife has been more concerned as the pain has caused him to take a melatonin, NyQuil, and allergy pills in order for him to get some rest at night.  FUNCTIONAL STATUS: Exercises three days a week (sit up, push up, planking).    ALLERGIES: Allergies  Allergen Reactions   Egg [Egg-Derived Products]     MEDICATION LIST PRIOR TO VISIT: Current Meds  Medication Sig   amLODipine-valsartan (EXFORGE) 5-160 MG tablet Take 1 tablet by mouth daily.   aspirin 81 MG chewable tablet Chew 81 mg by mouth daily.   JARDIANCE 10 MG TABS tablet Take 10 mg by mouth daily.   metFORMIN (GLUCOPHAGE-XR) 500 MG 24 hr tablet Take 500 mg by mouth 2 (two) times daily.   nitroGLYCERIN (NITROSTAT) 0.4 MG SL tablet PLACE 1 TABLET UNDER THE TONGUE EVERY  5 MINUTES AS NEEDED CHEST PAIN. IF YOU REQUIRE MORE THAN 2 TABLETS 5 MINUTES APART GO TO ER.   Omega-3 Fatty Acids (FISH OIL PO) Take 1,600 mg by mouth daily.   omeprazole (PRILOSEC) 40 MG capsule Take 40 mg by mouth as needed.   rosuvastatin (CRESTOR) 5 MG tablet Take 5 mg by mouth at bedtime.     PAST MEDICAL HISTORY: Past Medical History:  Diagnosis Date   Allergy    Coronary artery disease    Diabetes mellitus without complication (HCC)    Hyperlipidemia    Hypertension     PAST SURGICAL HISTORY: Past Surgical History:  Procedure Laterality Date   CORONARY PRESSURE/FFR STUDY N/A 02/16/2020   Procedure: INTRAVASCULAR PRESSURE WIRE/FFR STUDY;  Surgeon: Yates Decamp, MD;  Location: [facility] INVASIVE CV LAB;  Service: Cardiovascular;  Laterality: N/A;   LEFT HEART CATH AND CORONARY ANGIOGRAPHY N/A 02/16/2020   Procedure: LEFT HEART CATH AND CORONARY ANGIOGRAPHY;  Surgeon: Yates Decamp, MD;  Location: [facility] INVASIVE CV LAB;  Service: Cardiovascular;  Laterality: N/A;     FAMILY HISTORY: The patient family history includes Cancer (age of onset: 10) in his father; Diabetes in his father, mother, and sister; Hyperlipidemia in his sister; Hypertension in his father and mother; Stroke in his sister.  SOCIAL HISTORY:  The patient  reports that he has never smoked. He has never used smokeless tobacco. He reports current alcohol use. He reports that he does not use  drugs.  REVIEW OF SYSTEMS: Review of Systems  Cardiovascular:  Negative for chest pain, claudication, dyspnea on exertion, irregular heartbeat, leg swelling, near-syncope, orthopnea, palpitations, paroxysmal nocturnal dyspnea and syncope.  Respiratory:  Negative for shortness of breath.   Hematologic/Lymphatic: Negative for bleeding problem.  Musculoskeletal:  Negative for muscle cramps and myalgias.  Neurological:  Negative for dizziness and light-headedness.    PHYSICAL EXAM:    09/03/2022    2:01 PM 07/23/2022   11:39 AM 02/06/2022     8:57 AM  Vitals with BMI  Height 6\' 1"  6\' 1"  6\' 1"   Weight 182 lbs 13 oz 180 lbs 180 lbs 6 oz  BMI 24.12 23.75 23.81  Systolic 125 130 161  Diastolic 82 83 81  Pulse 65 69 72   Physical Exam  Constitutional: No distress.  Age appropriate, hemodynamically stable.   Neck: No JVD present.  Cardiovascular: Normal rate, regular rhythm, S1 normal, S2 normal, intact distal pulses and normal pulses. Exam reveals no gallop, no S3 and no S4.  No murmur heard. Pulses:      Dorsalis pedis pulses are 2+ on the right side and 2+ on the left side.       Posterior tibial pulses are 2+ on the right side and 2+ on the left side.  Pulmonary/Chest: Effort normal and breath sounds normal. No stridor. He has no wheezes. He has no rales.  Abdominal: Soft. Bowel sounds are normal. He exhibits no distension. There is no abdominal tenderness.  Musculoskeletal:        General: No edema.     Cervical back: Neck supple.  Neurological: He is alert and oriented to person, place, and time. He has intact cranial nerves (2-12).  Skin: Skin is warm and moist.   CARDIAC DATABASE: EKG: 09/03/2022: Sinus bradycardia, 59 bpm, T WI in inferolateral leads consider ischemia, without underlying injury pattern.  Similar findings on prior EKG 02/06/2022.  Echocardiogram: 01/28/2020:  Normal LV systolic function with visual EF 60-65%.  Left ventricle cavity is normal in size. Normal global wall motion. Normal diastolic filling pattern, normal LAP.  Mild tricuspid regurgitation. No evidence of pulmonary hypertension.  No other significant valvular heart disease.  No prior study for comparison.   Stress Testing: No results found for this or any previous visit from the past 1095 days.  Coronary CTA: 01/26/2020  1. Coronary calcium score of 371. This was 96th percentile for age and sex matched control. 2. Normal coronary origin with right dominance. 3. CAD-RADS = 3. Moderate stenosis within proximal LAD, Moderate stenosis  within proximal LCx. Mild stenosis within proximal RCA. 4. Study will not be sent for CT-FFR due to artifact from frequent PVCs and misregistration. Consider invasive coronary angiography, if clinically indicated. Non-cardiac portion: Negative over-read examination  Heart Catheterization: Left Heart Catheterization and DFR to LAD and RCA 02/16/20:  LV: Normal LV systolic function, EDP 6 mmHg.  No pressure gradient across the aortic valve.  LVEF 55%. Left main: Large vessel.  No significant disease. LAD: Large vessel.  Proximal segment has a eccentric 50% stenosis.  There are several small to moderate-sized diagonals.  The stenosis is irregular.  No ulceration noted. Circumflex: Large caliber vessel.  Gives origin to 2 large OM branches.  There is mild disease in the midsegment constituting 20 to 30% stenosis. RCA: Dominant vessel.  Proximal segment has a small eccentric 60% stenosis.   DFR to proximal LAD = 0.92 and proximal RCA 0.95, not hemodynamically significant.   Recommendation:  Aggressive risk factor modification is indicated.  Some of this symptoms of angina could be related to coronary spasm and could consider discontinuing diuretics and switching to amlodipine.  70 mL contrast utilized.  LABORATORY DATA:    Latest Ref Rng & Units 05/11/2021    2:30 AM 02/16/2020    9:50 AM 02/16/2020    9:32 AM  CBC  WBC 4.0 - 10.5 K/uL 8.2   5.1   Hemoglobin 13.0 - 17.0 g/dL 84.1  32.4  40.1   Hematocrit 39.0 - 52.0 % 39.9  43.0  43.1   Platelets 150 - 400 K/uL 251   217        Latest Ref Rng & Units 05/11/2021    2:30 AM 02/16/2020    9:50 AM 02/16/2020    9:32 AM  CMP  Glucose 70 - 99 mg/dL 027  253  664   BUN 6 - 20 mg/dL 18  17  14    Creatinine 0.61 - 1.24 mg/dL 4.03  4.74  2.59   Sodium 135 - 145 mmol/L 133  135  135   Potassium 3.5 - 5.1 mmol/L 3.5  3.7  3.5   Chloride 98 - 111 mmol/L 96  93  95   CO2 22 - 32 mmol/L 24   27   Calcium 8.9 - 10.3 mg/dL 9.3   56.3     No components  found for: "NTPROBNP" No results for input(s): "PROBNP" in the last 8760 hours. No results for input(s): "TSH" in the last 8760 hours.  BMP No results for input(s): "NA", "K", "CL", "CO2", "GLUCOSE", "BUN", "CREATININE", "CALCIUM", "GFRNONAA", "GFRAA" in the last 8760 hours.    HEMOGLOBIN A1C No results found for: "HGBA1C", "MPG"  External Labs: Collected: 12/04/2019, outside labs received from PCP Creatinine 1.42 mg/dL. eGFR: 63 mL/min per 1.73 m Potassium 4.2 Lipid profile: Total cholesterol 153, triglycerides 97, HDL 41, LDL 92, non-HDL 111 AST 20, ALT 18 Hemoglobin A1c: 8.5  External Labs: Collected: February 14, 2021 provided by Dr. Diamantina [place] office. Total cholesterol 123, HDL 45, triglycerides 63, LDL 64. BUN 17, creatinine 1.36 mg/dL. BUN to creatinine ratio 13. Sodium 135, potassium 4.1, chloride 96, bicarb 29. AST 19, ALT 11, alkaline phosphatase 36. A1c 8.0. Hemoglobin 12.5 g/dL, hematocrit 87%  IMPRESSION:    ICD-10-CM   1. Precordial pain  R07.2 EKG 12-Lead    2. Nonobstructive atherosclerosis of coronary artery  I25.10     3. Total CAC 371 AU, 96th percentile  I25.10    I25.84     4. Benign hypertension  I10     5. Non-insulin dependent type 2 diabetes mellitus (HCC)  E11.9     6. Mixed hyperlipidemia  E78.2         RECOMMENDATIONS:  CARTIER LLANES is a 62 y.o. male whose past medical history and cardiac risk factors include: Moderate coronary artery disease with calcified coronary atherosclerosis, Hypertension, hyperlipidemia, NIDDM Type II, GERD.  Nonobstructive atherosclerosis of coronary artery /moderate CAC Precordial pain Based on symptoms precordial pain appears to be noncardiac. Overall similar in character when compared to 2022.  However requires more medications to fall asleep at night. Functional status remains stable. EKG shows sinus rhythm with ST-T changes in inferolateral leads similar to prior tracings Patient is able to  exercise and has not noted exertional chest pain or heart failure symptoms Total CAC 371, 96 percentile. Reviewed the prior ischemic workup including coronary CTA and left heart catheterization. His story is not consistent  with ACS or stable angina.  However given his underlying CAD and multiple cardiovascular risk factors including diabetes progression of CAD cannot be entirely ruled out.  To further risk stratify him as his pretest probability is at least intermediate but shared decision was to proceed with exercise nuclear stress test. Exercise nuclear stress test as requested is due to abnormal EKG at baseline.  Non-insulin dependent type 2 diabetes mellitus (HCC) Currently on metformin, Jardiance, statin therapy. I do not have the most recent lipid profile for review.  I have asked him to provide [place] a copy of the next office visit. His Crestor has been reduced down from 20 mg p.o. nightly to 5 mg p.o. nightly.  Benign hypertension Office blood pressures are very well controlled. Medications reconciled.  Mixed hyperlipidemia Currently on rosuvastatin.   He denies myalgia or other side effects. Recommend a goal LDL of <55 mg/dL. Currently managed by primary care provider. Patient will bring [place] a copy of labs at the next office visit  No prior history of herpes zoster's/single shingles.  However the discomfort that he describes along the left precordium is along the rib suggestive of possible neuropathy.  I have asked him to follow-up with PCP for further further guidance and assistance.  He currently takes melatonin, NyQuil, and allergy tablets to go to sleep.  I have also endorsed that this is too much medications to help him fall asleep.  He needs to discuss this further with PCP and even consider sleep medicine evaluation.  FINAL MEDICATION LIST END OF ENCOUNTER: No orders of the defined types were placed in this encounter.    Current Outpatient Medications:     amLODipine-valsartan (EXFORGE) 5-160 MG tablet, Take 1 tablet by mouth daily., Disp: , Rfl:    aspirin 81 MG chewable tablet, Chew 81 mg by mouth daily., Disp: , Rfl:    JARDIANCE 10 MG TABS tablet, Take 10 mg by mouth daily., Disp: , Rfl:    metFORMIN (GLUCOPHAGE-XR) 500 MG 24 hr tablet, Take 500 mg by mouth 2 (two) times daily., Disp: , Rfl:    nitroGLYCERIN (NITROSTAT) 0.4 MG SL tablet, PLACE 1 TABLET UNDER THE TONGUE EVERY 5 MINUTES AS NEEDED CHEST PAIN. IF YOU REQUIRE MORE THAN 2 TABLETS 5 MINUTES APART GO TO ER., Disp: 25 tablet, Rfl: 0   Omega-3 Fatty Acids (FISH OIL PO), Take 1,600 mg by mouth daily., Disp: , Rfl:    omeprazole (PRILOSEC) 40 MG capsule, Take 40 mg by mouth as needed., Disp: , Rfl:    rosuvastatin (CRESTOR) 5 MG tablet, Take 5 mg by mouth at bedtime., Disp: , Rfl:    metoprolol succinate (TOPROL-XL) 25 MG 24 hr tablet, Take 0.5 tablets (12.5 mg total) by mouth every morning., Disp: 15 tablet, Rfl: 0  Orders Placed This Encounter  Procedures   EKG 12-Lead    There are no Patient Instructions on file for this visit.   --Continue cardiac medications as reconciled in final medication list. --Return in about 6 weeks (around 10/15/2022) for Follow up, Chest pain, Review test results. Or sooner if needed. --Continue follow-up with your primary care physician regarding the management of your other chronic comorbid conditions.  Patient's questions and concerns were addressed to his satisfaction. He voices understanding of the instructions provided during this encounter.   This note was created using a voice recognition software as a result there may be grammatical errors inadvertently enclosed that do not reflect the nature of this encounter. Every attempt is made  to correct such errors.  Tessa Lerner, [place], [facility]  Pager:  [phone] Office: [phone]

## 2022-10-29 ENCOUNTER — Ambulatory Visit: Payer: Self-pay | Admitting: Cardiology

## 2022-10-31 ENCOUNTER — Ambulatory Visit: Payer: 59 | Admitting: Cardiology

## 2023-01-26 LAB — LAB REPORT - SCANNED
A1c: 7
EGFR: 61
PSA, Total: 5.84

## 2023-02-06 ENCOUNTER — Ambulatory Visit: Payer: Medicaid Other | Attending: Cardiology | Admitting: Cardiology

## 2023-02-06 ENCOUNTER — Encounter: Payer: Self-pay | Admitting: Cardiology

## 2023-02-06 VITALS — BP 128/84 | HR 72 | Resp 16 | Ht 73.0 in | Wt 183.0 lb

## 2023-02-06 DIAGNOSIS — E119 Type 2 diabetes mellitus without complications: Secondary | ICD-10-CM | POA: Diagnosis not present

## 2023-02-06 DIAGNOSIS — I10 Essential (primary) hypertension: Secondary | ICD-10-CM | POA: Diagnosis not present

## 2023-02-06 DIAGNOSIS — I2510 Atherosclerotic heart disease of native coronary artery without angina pectoris: Secondary | ICD-10-CM

## 2023-02-06 DIAGNOSIS — E782 Mixed hyperlipidemia: Secondary | ICD-10-CM

## 2023-02-06 DIAGNOSIS — I2584 Coronary atherosclerosis due to calcified coronary lesion: Secondary | ICD-10-CM

## 2023-02-06 NOTE — Patient Instructions (Signed)
Medication Instructions:  NONE *If you need a refill on your cardiac medications before your next appointment, please call your pharmacy*   Lab Work: NONE If you have labs (blood work) drawn today and your tests are completely normal, you will receive your results only by: MyChart Message (if you have MyChart) OR A paper copy in the mail If you have any lab test that is abnormal or we need to change your treatment, we will call you to review the results.   Testing/Procedures: NONE   Follow-Up: At [facility], you and your health needs are our priority.  As part of our continuing mission to provide you with exceptional heart care, we have created designated Provider Care Teams.  These Care Teams include your primary Cardiologist (physician) and Advanced Practice Providers (APPs -  Physician Assistants and Nurse Practitioners) who all work together to provide you with the care you need, when you need it.  We recommend signing up for the patient portal called "MyChart".  Sign up information is provided on this After Visit Summary.  MyChart is used to connect with patients for Virtual Visits (Telemedicine).  Patients are able to view lab/test results, encounter notes, upcoming appointments, etc.  Non-urgent messages can be sent to your provider as well.   To learn more about what you can do with MyChart, go to [email or web address].    Your next appointment:   1 year(s)  Provider:   Tessa Lerner, DO

## 2023-02-06 NOTE — Progress Notes (Signed)
Cardiology Office Note:  .   Date:  02/06/2023  ID:  Arthur Sanders, DOB 09-14-1960, MRN [id number] PCP:  Gwenyth Bender, MD  Former Cardiology Providers: NA [facility] Providers Cardiologist:  Tessa Lerner, DO , [facility] (established care 01/20/2020) Electrophysiologist:  None  Click to update primary MD,subspecialty MD or APP then REFRESH:1}    Chief Complaint  Patient presents with   Nonobstructive atherosclerosis of coronary artery   Follow-up    History of Present Illness: .   Arthur Sanders is a 63 y.o.  African-American male whose past medical history and cardiovascular risk factors includes: Moderate coronary artery disease with calcified coronary atherosclerosis, Hypertension, hyperlipidemia, NIDDM Type II, GERD.   Patient is being followed by the practice given his history of nonobstructive CAD.  Presents today for 16-month follow-up visit.  Over the last 6 months he denies any anginal chest pain or heart failure symptoms.  Overall functional capacity remains stable. Exercises three days a week (sit up, push up, planking).    Review of Systems: .   Review of Systems  Cardiovascular:  Negative for chest pain, claudication, irregular heartbeat, leg swelling, near-syncope, orthopnea, palpitations, paroxysmal nocturnal dyspnea and syncope.  Respiratory:  Negative for shortness of breath.   Hematologic/Lymphatic: Negative for bleeding problem.    Studies Reviewed:   EKG: EKG Interpretation Date/Time:  Wednesday February 06 2023 10:13:00 EST Ventricular Rate:  71 PR Interval:  172 QRS Duration:  88 QT Interval:  370 QTC Calculation: 402 R Axis:   25  Text Interpretation: Normal sinus rhythm T wave abnormality, consider inferior ischemia When compared with ECG of 11-May-2021 02:55, No significant change since last tracing Confirmed by Tessa Lerner [phone]) on 02/06/2023 10:22:26 AM  Echocardiogram: 01/28/2020:  Normal LV systolic function with visual EF 60-65%.   Left ventricle cavity is normal in size. Normal global wall motion. Normal diastolic filling pattern, normal LAP.  Mild tricuspid regurgitation. No evidence of pulmonary hypertension.  No other significant valvular heart disease.  No prior study for comparison.  Coronary CTA: 01/26/2020  1. Coronary calcium score of 371. This was 96th percentile for age and sex matched control. 2. Normal coronary origin with right dominance. 3. CAD-RADS = 3. Moderate stenosis within proximal LAD, Moderate stenosis within proximal LCx. Mild stenosis within proximal RCA. 4. Study will not be sent for CT-FFR due to artifact from frequent PVCs and misregistration. Consider invasive coronary angiography, if clinically indicated. Non-cardiac portion: Negative over-read examination   Heart Catheterization: Left Heart Catheterization and DFR to LAD and RCA 02/16/20:  LV: Normal LV systolic function, EDP 6 mmHg.  No pressure gradient across the aortic valve.  LVEF 55%. Left main: Large vessel.  No significant disease. LAD: Large vessel.  Proximal segment has a eccentric 50% stenosis.  There are several small to moderate-sized diagonals.  The stenosis is irregular.  No ulceration noted. Circumflex: Large caliber vessel.  Gives origin to 2 large OM branches.  There is mild disease in the midsegment constituting 20 to 30% stenosis. RCA: Dominant vessel.  Proximal segment has a small eccentric 60% stenosis.   DFR to proximal LAD = 0.92 and proximal RCA 0.95, not hemodynamically significant.   Recommendation: Aggressive risk factor modification is indicated.  Some of this symptoms of angina could be related to coronary spasm and could consider discontinuing diuretics and switching to amlodipine.  70 mL contrast utilized.  RADIOLOGY: NA  Risk Assessment/Calculations:   N/A   Labs:    External  Labs: Collected: 12/04/2019, outside labs received from PCP Creatinine 1.42 mg/dL. eGFR: 63 mL/min per 1.73  m Potassium 4.2 Lipid profile: Total cholesterol 153, triglycerides 97, HDL 41, LDL 92, non-HDL 111 AST 20, ALT 18 Hemoglobin A1c: 8.5   External Labs: Collected: February 14, 2021 provided by Dr. Diamantina [place] office. Total cholesterol 123, HDL 45, triglycerides 63, LDL 64. BUN 17, creatinine 1.36 mg/dL. BUN to creatinine ratio 13. Sodium 135, potassium 4.1, chloride 96, bicarb 29. AST 19, ALT 11, alkaline phosphatase 36. A1c 8.0. Hemoglobin 12.5 g/dL, hematocrit 16%  Physical Exam:    Today's Vitals   02/06/23 1007  BP: 128/84  Pulse: 72  Resp: 16  SpO2: 98%  Weight: 183 lb (83 kg)  Height: 6\' 1"  (1.854 m)   Body mass index is 24.14 kg/m. Wt Readings from Last 3 Encounters:  02/06/23 183 lb (83 kg)  09/03/22 182 lb 12.8 oz (82.9 kg)  07/23/22 180 lb (81.6 kg)    Physical Exam  Constitutional: No distress.  Age appropriate, hemodynamically stable.   Neck: No JVD present.  Cardiovascular: Normal rate, regular rhythm, S1 normal, S2 normal, intact distal pulses and normal pulses. Exam reveals no gallop, no S3 and no S4.  No murmur heard. Pulses:      Dorsalis pedis pulses are 2+ on the right side and 2+ on the left side.       Posterior tibial pulses are 2+ on the right side and 2+ on the left side.  Pulmonary/Chest: Effort normal and breath sounds normal. No stridor. He has no wheezes. He has no rales.  Abdominal: Soft. Bowel sounds are normal. He exhibits no distension. There is no abdominal tenderness.  Musculoskeletal:        General: No edema.     Cervical back: Neck supple.  Neurological: He is alert and oriented to person, place, and time. He has intact cranial nerves (2-12).  Skin: Skin is warm and moist.   Impression & Recommendation(s):  Impression:   ICD-10-CM   1. Nonobstructive atherosclerosis of coronary artery  I25.10 EKG 12-Lead    2. Total CAC 371 AU, 96th percentile  I25.10 EKG 12-Lead   I25.84     3. Benign hypertension  I10     4. Non-insulin  dependent type 2 diabetes mellitus (HCC)  E11.9     5. Mixed hyperlipidemia  E78.2        Recommendation(s):  Nonobstructive atherosclerosis of coronary artery Total CAC 371 AU, 96th percentile Denies anginal chest pain or heart failure symptoms. Overall functional capacity remains stable. EKG notes sinus rhythm with similar ST-T changes when compared to prior tracing. Continue antiplatelet therapy. Continue statin therapy. No recent labs available for review, states that he has had them updated with PCP.  Benign hypertension Office and home blood pressures are well-controlled. Continue amlodipine/valsartan 5/160 mg p.o. daily. Continue Jardiance 10 mg p.o. daily. Reemphasized importance of low-salt diet.  Non-insulin dependent type 2 diabetes mellitus (HCC) Currently on metformin 500 mg p.o. twice daily Currently on Jardiance 10 mg p.o. daily Reemphasized importance of glycemic control, currently on ARB and statin therapy  Mixed hyperlipidemia Currently on Crestor 5 mg p.o. daily.   He denies myalgia or other side effects. Recommend a goal LDL <55 mg/dL. Patient forgot to bring his most recent labs available for review.  Not available in Care Everywhere or media section. Requested that he send [place] a copy for reference. Cardiology is following peripherally.  Orders Placed:  Orders Placed This Encounter  Procedures   EKG 12-Lead    Final Medication List:   No orders of the defined types were placed in this encounter.   There are no discontinued medications.   Current Outpatient Medications:    amLODipine-valsartan (EXFORGE) 5-160 MG tablet, Take 1 tablet by mouth daily., Disp: , Rfl:    aspirin 81 MG chewable tablet, Chew 81 mg by mouth daily., Disp: , Rfl:    JARDIANCE 10 MG TABS tablet, Take 10 mg by mouth daily., Disp: , Rfl:    metFORMIN (GLUCOPHAGE-XR) 500 MG 24 hr tablet, Take 500 mg by mouth 2 (two) times daily., Disp: , Rfl:    metoprolol succinate (TOPROL-XL)  25 MG 24 hr tablet, Take 0.5 tablets (12.5 mg total) by mouth every morning., Disp: 15 tablet, Rfl: 0   nitroGLYCERIN (NITROSTAT) 0.4 MG SL tablet, PLACE 1 TABLET UNDER THE TONGUE EVERY 5 MINUTES AS NEEDED CHEST PAIN. IF YOU REQUIRE MORE THAN 2 TABLETS 5 MINUTES APART GO TO ER., Disp: 25 tablet, Rfl: 0   Omega-3 Fatty Acids (FISH OIL PO), Take 1,600 mg by mouth daily., Disp: , Rfl:    omeprazole (PRILOSEC) 40 MG capsule, Take 40 mg by mouth as needed., Disp: , Rfl:    rosuvastatin (CRESTOR) 5 MG tablet, Take 5 mg by mouth at bedtime., Disp: , Rfl:   Consent:   NA  Disposition:   1 year follow-up sooner if needed  His questions and concerns were addressed to his satisfaction. He voices understanding of the recommendations provided during this encounter.   Discussed management of least 2 chronic comorbid conditions, ordered at least 1 unique test with self interpretation, reviewed the results of the echo and CTA and prior angiography at today's office visit.  Reviewed care everywhere but no recent labs available for review/reference.  Signed, Tessa Lerner, DO, [facility]  [facility]  [address] [place], [place] [address] 02/06/2023 10:36 AM

## 2023-02-07 ENCOUNTER — Encounter: Payer: Self-pay | Admitting: Cardiology

## 2023-02-10 NOTE — Telephone Encounter (Signed)
External Labs: Collected: January 25, 2023 provided by the patient after the visit. A1c 7.0  And only included the hemoglobin A1c no other labs are included.  Teddie Mehta Alsen, DO, [facility]
